# Patient Record
Sex: Male | Born: 1985 | Race: Black or African American | Hispanic: No | Marital: Single | Smoking: Never smoker
Health system: Southern US, Community
[De-identification: ages and names within clinical notes are randomized; demographics above are authoritative.]

## PROBLEM LIST (undated history)

## (undated) DIAGNOSIS — T7840XA Allergy, unspecified, initial encounter: Secondary | ICD-10-CM

## (undated) DIAGNOSIS — J302 Other seasonal allergic rhinitis: Secondary | ICD-10-CM

## (undated) DIAGNOSIS — M549 Dorsalgia, unspecified: Secondary | ICD-10-CM

## (undated) DIAGNOSIS — G43909 Migraine, unspecified, not intractable, without status migrainosus: Secondary | ICD-10-CM

## (undated) HISTORY — DX: Other seasonal allergic rhinitis: J30.2

## (undated) HISTORY — DX: Allergy, unspecified, initial encounter: T78.40XA

---

## 2006-09-26 ENCOUNTER — Emergency Department (HOSPITAL_COMMUNITY): Admission: EM | Admit: 2006-09-26 | Discharge: 2006-09-26 | Payer: Self-pay | Admitting: Emergency Medicine

## 2008-06-13 ENCOUNTER — Emergency Department (HOSPITAL_COMMUNITY): Admission: EM | Admit: 2008-06-13 | Discharge: 2008-06-13 | Payer: Self-pay | Admitting: Family Medicine

## 2009-06-28 ENCOUNTER — Emergency Department (HOSPITAL_COMMUNITY): Admission: EM | Admit: 2009-06-28 | Discharge: 2009-06-28 | Payer: Self-pay | Admitting: Family Medicine

## 2010-02-23 ENCOUNTER — Ambulatory Visit: Payer: Self-pay | Admitting: Family Medicine

## 2010-08-22 LAB — HIV ANTIBODY (ROUTINE TESTING W REFLEX): HIV: NONREACTIVE

## 2010-08-22 LAB — POCT RAPID STREP A (OFFICE): Streptococcus, Group A Screen (Direct): NEGATIVE

## 2011-06-21 ENCOUNTER — Ambulatory Visit
Admission: RE | Admit: 2011-06-21 | Discharge: 2011-06-21 | Disposition: A | Discharge status: Home / Self Care | Payer: 59 | Source: Ambulatory Visit | Attending: Medical | Admitting: Medical

## 2011-06-21 ENCOUNTER — Other Ambulatory Visit: Payer: Self-pay | Admitting: Medical

## 2011-06-21 ENCOUNTER — Ambulatory Visit (INDEPENDENT_AMBULATORY_CARE_PROVIDER_SITE_OTHER): Payer: 59 | Admitting: Medical

## 2011-06-21 ENCOUNTER — Encounter: Payer: Self-pay | Admitting: Medical

## 2011-06-21 VITALS — BP 118/70 | HR 100 | Temp 99.4°F | Resp 16 | Wt 143.0 lb

## 2011-06-21 DIAGNOSIS — R509 Fever, unspecified: Secondary | ICD-10-CM | POA: Insufficient documentation

## 2011-06-21 DIAGNOSIS — R05 Cough: Secondary | ICD-10-CM | POA: Insufficient documentation

## 2011-06-21 DIAGNOSIS — R52 Pain, unspecified: Secondary | ICD-10-CM

## 2011-06-21 LAB — POCT INFLUENZA A/B: Influenza B, POC: NEGATIVE

## 2011-06-21 NOTE — Patient Instructions (Signed)
Go for chest xray now. 

## 2011-06-21 NOTE — Progress Notes (Signed)
Subjective:  Anthony Mcintosh is a 26 y.o. male who presents for possible flu.  He has had a cough intermittently the last week and didn't feel sick.  However yesterday he had abrupt onset of fever to 101.8, body aches, chills, sinus pressure, headache, head congestion, ongoing cough, slight sore throat, slight chest congestion, some ear pressure, and using Tylenol multi symptom and Mucinex.  No specific sick contacts. 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 Otherwise has been in his normal state of health. No other relieving or aggravating factors.  No past medical history on file.   Review of systems: Gen.: Positive fever, chills Cardiac: No chest pain or palpitations Lungs: No shortness of breath or wheezing GI: No nausea, vomiting, diarrhea Extremity: No swelling   Objective:     Filed Vitals:   06/21/11 1541  BP: 118/70  Pulse: 100  Temp: 99.4 F (37.4 C)  Resp: 16     General: Ill-appearing, well-developed, well-nourished, ill-appearing Skin: Hot, moist HEENT: Nose inflamed and congested, clear conjunctiva, TMs pearly, no sinus tenderness, pharynx with erythema, no exudates Neck: Supple, nontender, shotty cervical adenopathy Heart: Regular rate and rhythm, normal S1, S2, no murmurs Lungs: Clear to auscultation bilaterally, no wheezes, rales, rhonchi Abdomen: Nontender non distended Extremities: Mild generalized tenderness      Assessment and Plan:   Encounter Diagnoses  Name Primary?  . Fever Yes  . Body aches   . Cough    Flu test here negative.  I sent him for chest x-ray which turned out to be negative. Prescribed Tamiflu and hydrocodone cough syrup for cough. Discussed supportive care, contagious nature of his illness, prevention, rest, good hydration, ibuprofen, and call or return if worse or not improving by the end of the week. Advise he stay home from work and avoid others until significantly improved and no fever. Discussed  possible secondary complications of the flu.

## 2011-08-03 ENCOUNTER — Ambulatory Visit (INDEPENDENT_AMBULATORY_CARE_PROVIDER_SITE_OTHER): Payer: 59 | Admitting: Medical

## 2011-08-03 ENCOUNTER — Encounter: Payer: Self-pay | Admitting: Medical

## 2011-08-03 VITALS — BP 110/60 | HR 72 | Temp 98.6°F | Resp 16 | Wt 144.0 lb

## 2011-08-03 DIAGNOSIS — J039 Acute tonsillitis, unspecified: Secondary | ICD-10-CM

## 2011-08-03 MED ORDER — AMOXICILLIN 875 MG PO TABS: 875.0000 mg | ORAL_TABLET | Freq: Two times a day (BID) | ORAL | Status: AC

## 2011-08-03 NOTE — Patient Instructions (Signed)
Salt water gargles, warm fluids, honey/lemon mixture, sore throat spray OTC, Ibuprofen up to 800mg  every 6 hours for pain.     Begin Amoxicillin twice daily for 10 days.    If worse or not improving let me know.    Tonsillitis Tonsils are lumps of lymphoid tissues at the back of the throat. Each tonsil has 20 crevices (crypts). Tonsils help fight nose and throat infections and keep infection from spreading to other parts of the body for the first 18 months of life. Tonsillitis is an infection of the throat that causes the tonsils to become red, tender, and swollen. CAUSES Sudden and, if treated, temporary (acute) tonsillitis is usually caused by infection with streptococcal bacteria. Long lasting (chronic) tonsillitis occurs when the crypts of the tonsils become filled with pieces of food and bacteria, which makes it easy for the tonsils to become constantly infected. SYMPTOMS  Symptoms of tonsillitis include:  A sore throat.   White patches on the tonsils.   Fever.   Tiredness.  DIAGNOSIS Tonsillitis can be diagnosed through a physical exam. Diagnosis can be confirmed with the results of lab tests, including a throat culture. TREATMENT  The goals of tonsillitis treatment include the reduction of the severity and duration of symptoms, prevention of associated conditions, and prevention of disease transmission. Tonsillitis caused by bacteria can be treated with antibiotics. Usually, treatment with antibiotics is started before the cause of the tonsillitis is known. However, if it is determined that the cause is not bacterial, antibiotics will not treat the tonsillitis. If attacks of tonsillitis are severe and frequent, your caregiver may recommend surgery to remove the tonsils (tonsillectomy). HOME CARE INSTRUCTIONS   Rest as much as possible and get plenty of sleep.   Drink plenty of fluids. While the throat is very sore, eat soft foods or liquids, such as sherbet, soups, or instant  breakfast drinks.   Eat frozen ice pops.   Older children and adults may gargle with a warm or cold liquid to help soothe the throat. Mix 1 teaspoon of salt in 1 cup of water.   Other family members who also develop a sore throat or fever should have a medical exam or throat culture.   Only take over-the-counter or prescription medicines for pain, discomfort, or fever as directed by your caregiver.  SEEK MEDICAL CARE IF:   Your baby is older than 3 months with a rectal temperature of 100.5 F (38.1 C) or higher for more than 1 day.   Large, tender lumps develop in your neck.   A rash develops.   Green, yellow-brown, or bloody substance is coughed up.   You are unable to swallow liquids or food for 24 hours.   Your child is unable to swallow food or liquids for 12 hours.  SEEK IMMEDIATE MEDICAL CARE IF:   You develop any new symptoms such as vomiting, severe headache, stiff neck, chest pain, or trouble breathing or swallowing.   You have severe throat pain along with drooling or voice changes.   You have severe pain, unrelieved with recommended medications.   You are unable to fully open the mouth.   You develop redness, swelling, or severe pain anywhere in the neck.   You have a fever.   Your baby is older than 3 months with a rectal temperature of 102 F (38.9 C) or higher.   Your baby is 33 months old or younger with a rectal temperature of 100.4 F (38 C) or higher.  MAKE SURE YOU:   Understand these instructions.   Will watch your condition.   Will get help right away if you are not doing well or get worse.  Document Released: 03/02/2005 Document Revised: 02/02/2011 Document Reviewed: 07/29/2010 [facility] Patient Information 2012 [place], [place].

## 2011-08-03 NOTE — Progress Notes (Signed)
Subjective:  Anthony Mcintosh is a 26 y.o. male who presents for 4 day history of sore throat, worsening, pain with swallowing, right ear pressure, nasal congestion, some cough, using Sudafed and Flonase. He is also using some ibuprofen for pain. He is a nonsmoker. No recent sick contacts. No other aggravating or relieving factors.  I saw him recently for influenza. He did end up taking Tamiflu and got completely over the infection.   No past medical history on file.  ROS:  general: No fever, chills, sweats Skin: No rash HEENT: No sinus pressure Heart: No chest pain or palpitations Lungs: The shortness of breath or wheezing GI: No nausea, vomiting, diarrhea  Objective:      Filed Vitals:   08/03/11 1548  BP: 110/60  Pulse: 72  Temp: 98.6 F (37 C)  Resp: 16    General appearance: no distress, WD/WN, somewhat ill-appearing HEENT: normocephalic, conjunctiva/corneas normal, sclerae anicteric, nares patent, no discharge or erythema, pharynx with erythema, tonsils 2+ with erythema and purulent exudate.  Oral cavity: MMM, no lesions  Neck: supple, +shoddy lymphadenopathy, no thyromegaly Heart: RRR, normal S1, S2, no murmurs Lungs: CTA bilaterally, no wheezes, rhonchi, or rales Abdomen: +bs, soft, non tender, non distended, no masses, no hepatomegaly, no splenomegaly  Assessment and Plan:   Encounter Diagnosis  Name Primary?  . Tonsillitis with exudate Yes   Prescription for amoxicillin, discussed symptomatic treatment including salt water gargles, warm fluids, rest, hydrate well, can use over-the-counter ibuprofen  for throat pain, fever, or malaise. If worse or not improving within 2-3 days, call or return.

## 2011-08-18 ENCOUNTER — Emergency Department (INDEPENDENT_AMBULATORY_CARE_PROVIDER_SITE_OTHER)
Admission: EM | Admit: 2011-08-18 | Discharge: 2011-08-18 | Disposition: A | Discharge status: Home / Self Care | Payer: 59 | Source: Home / Self Care | Attending: Emergency Medicine | Admitting: Emergency Medicine

## 2011-08-18 ENCOUNTER — Encounter (HOSPITAL_COMMUNITY): Payer: Self-pay | Admitting: *Deleted

## 2011-08-18 DIAGNOSIS — S335XXA Sprain of ligaments of lumbar spine, initial encounter: Secondary | ICD-10-CM

## 2011-08-18 DIAGNOSIS — S161XXA Strain of muscle, fascia and tendon at neck level, initial encounter: Secondary | ICD-10-CM

## 2011-08-18 DIAGNOSIS — S139XXA Sprain of joints and ligaments of unspecified parts of neck, initial encounter: Secondary | ICD-10-CM

## 2011-08-18 MED ORDER — HYDROCODONE-ACETAMINOPHEN 5-325 MG PO TABS: 2.0000 | ORAL_TABLET | ORAL | Status: AC | PRN

## 2011-08-18 MED ORDER — METAXALONE 800 MG PO TABS: 800.0000 mg | ORAL_TABLET | Freq: Three times a day (TID) | ORAL | Status: AC

## 2011-08-18 MED ORDER — IBUPROFEN 600 MG PO TABS: 600.0000 mg | ORAL_TABLET | Freq: Four times a day (QID) | ORAL | Status: AC | PRN

## 2011-08-18 NOTE — Discharge Instructions (Signed)
Take the medication as written. Take 1 gram of tylenol with the motrin up to 4 times a day as needed for pain and fever. This is an effective combination for pain. Take the hydrocodone/norco only for severe pain. Do not take the tylenol and hydrocodone/norcoas they both have tylenol in them and too much can hurt your liver. Return if you get worse, have a  fever >100.4, or for any concerns.   Go to www.goodrx.com to look up your medications. This will give you a list of where you can find your prescriptions at the most affordable prices.
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
## 2012-06-08 ENCOUNTER — Ambulatory Visit (INDEPENDENT_AMBULATORY_CARE_PROVIDER_SITE_OTHER): Payer: 59 | Admitting: Family Medicine

## 2012-06-08 ENCOUNTER — Encounter: Payer: Self-pay | Admitting: Family Medicine

## 2012-06-08 VITALS — BP 100/60 | HR 144 | Temp 103.2°F | Wt 142.0 lb

## 2012-06-08 DIAGNOSIS — J039 Acute tonsillitis, unspecified: Secondary | ICD-10-CM

## 2012-06-08 DIAGNOSIS — J029 Acute pharyngitis, unspecified: Secondary | ICD-10-CM

## 2012-06-08 LAB — POCT RAPID STREP A (OFFICE): Rapid Strep A Screen: NEGATIVE

## 2012-06-08 MED ORDER — AMOXICILLIN 875 MG PO TABS: 875.0000 mg | ORAL_TABLET | Freq: Two times a day (BID) | ORAL | Status: DC

## 2012-06-08 NOTE — Patient Instructions (Signed)
Tonsillitis Tonsils are lumps of lymphoid tissues at the back of the throat. Each tonsil has 20 crevices (crypts). Tonsils help fight nose and throat infections and keep infection from spreading to other parts of the body for the first 18 months of life. Tonsillitis is an infection of the throat that causes the tonsils to become red, tender, and swollen. CAUSES Sudden and, if treated, temporary (acute) tonsillitis is usually caused by infection with streptococcal bacteria. Long lasting (chronic) tonsillitis occurs when the crypts of the tonsils become filled with pieces of food and bacteria, which makes it easy for the tonsils to become constantly infected. SYMPTOMS  Symptoms of tonsillitis include:  A sore throat.  White patches on the tonsils.  Fever.  Tiredness. DIAGNOSIS Tonsillitis can be diagnosed through a physical exam. Diagnosis can be confirmed with the results of lab tests, including a throat culture. TREATMENT  The goals of tonsillitis treatment include the reduction of the severity and duration of symptoms, prevention of associated conditions, and prevention of disease transmission. Tonsillitis caused by bacteria can be treated with antibiotics. Usually, treatment with antibiotics is started before the cause of the tonsillitis is known. However, if it is determined that the cause is not bacterial, antibiotics will not treat the tonsillitis. If attacks of tonsillitis are severe and frequent, your caregiver may recommend surgery to remove the tonsils (tonsillectomy). HOME CARE INSTRUCTIONS   Rest as much as possible and get plenty of sleep.  Drink plenty of fluids. While the throat is very sore, eat soft foods or liquids, such as sherbet, soups, or instant breakfast drinks.  Eat frozen ice pops.  Older children and adults may gargle with a warm or cold liquid to help soothe the throat. Mix 1 teaspoon of salt in 1 cup of water.  Other family members who also develop a sore  throat or fever should have a medical exam or throat culture.  Only take over-the-counter or prescription medicines for pain, discomfort, or fever as directed by your caregiver.  If you are given antibiotics, take them as directed. Finish them even if you start to feel better. SEEK MEDICAL CARE IF:   Your baby is older than 3 months with a rectal temperature of 100.5 F (38.1 C) or higher for more than 1 day.  Large, tender lumps develop in your neck.  A rash develops.  Green, yellow-brown, or bloody substance is coughed up.  You are unable to swallow liquids or food for 24 hours.  Your child is unable to swallow food or liquids for 12 hours. SEEK IMMEDIATE MEDICAL CARE IF:   You develop any new symptoms such as vomiting, severe headache, stiff neck, chest pain, or trouble breathing or swallowing.  You have severe throat pain along with drooling or voice changes.  You have severe pain, unrelieved with recommended medications.  You are unable to fully open the mouth.  You develop redness, swelling, or severe pain anywhere in the neck.  You have a fever.  Your baby is older than 3 months with a rectal temperature of 102 F (38.9 C) or higher.  Your baby is 3 months old or younger with a rectal temperature of 100.4 F (38 C) or higher. MAKE SURE YOU:   Understand these instructions.  Will watch your condition.  Will get help right away if you are not doing well or get worse. Document Released: 03/02/2005 Document Revised: 08/15/2011 Document Reviewed: 07/29/2010 [employer] Patient Information 2013 [employer].  

## 2012-06-08 NOTE — Progress Notes (Signed)
  Subjective:    Patient ID: Anthony Mcintosh, male    DOB: 04-27-1986, 27 y.o.   MRN: [id number]  HPI He has a one-day history of started with sore throat, fever, chills, myalgias, nasal congestion. No cough, chest congestion, earache, nausea or vomiting. He has no underlying allergies and he does not smoke.   Review of Systems     Objective:   Physical Exam alert and in no distress. Tympanic membranes and canals are normal. Throat is clear. Tonsils are red swollen with exudates. Neck is supple with adenopathy no thyromegaly. Cardiac exam shows a tachycardia without murmurs or gallops. Lungs are clear to auscultation. Strep screen negative      Assessment & Plan:   1. Sore throat  POCT rapid strep A  2. Tonsillitis with exudate  amoxicillin (AMOXIL) 875 MG tablet   in spite of negative strep screen, he has all the clinical criteria for strep and therefore I will treat him.

## 2012-11-08 NOTE — Progress Notes (Deleted)
  Subjective:    Patient ID: Anthony Mcintosh, male    DOB: 09-24-1985, 27 y.o.   MRN: [id number]  HPI    Review of Systems  Constitutional: Negative for chills.       Objective:   Physical Exam        Assessment & Plan:

## 2012-11-09 ENCOUNTER — Ambulatory Visit (INDEPENDENT_AMBULATORY_CARE_PROVIDER_SITE_OTHER): Payer: 59 | Admitting: Medical

## 2012-11-09 ENCOUNTER — Encounter: Payer: Self-pay | Admitting: Medical

## 2012-11-09 VITALS — BP 100/80 | HR 66 | Temp 98.1°F | Resp 16 | Wt 139.0 lb

## 2012-11-09 DIAGNOSIS — L739 Follicular disorder, unspecified: Secondary | ICD-10-CM

## 2012-11-09 DIAGNOSIS — L738 Other specified follicular disorders: Secondary | ICD-10-CM

## 2012-11-09 MED ORDER — CEPHALEXIN 500 MG PO CAPS: 500.0000 mg | ORAL_CAPSULE | Freq: Three times a day (TID) | ORAL | Status: DC

## 2012-11-09 NOTE — Progress Notes (Signed)
Subjective:  Anthony Mcintosh is a 27 y.o. male who presents with 1 week hx/o knot in perineum area, then pain began yesterday 5/10.  Sensitive to touch and presure, discomfort with sitting for long periods.   Has had this 1-2 times prior in the last few years.  They resolved on their own, didn't get medical evaluation.  In the past the bumps were like pimples.   This is more sore than prior similar. In the past had drainage from a similar pimple like lesion in the same area. No hx/o cellulitis or abscess.  Denies fever, nausea, vomiting, diarrhea, blood in stool, no drainage.  No penile or scrotal swelling or pain.  No redness, no warmth.  Had BM every other day.  used laxative the other day for constipation.  Wonders if this is correlated with exercise or activity.  No GU change otherwise.  No abdominal pain.   No other aggravating or relieving factors.    No other c/o.  The following portions of the patient's history were reviewed and updated as appropriate: allergies, current medications, past family history, past medical history, past social history, past surgical history and problem list.  ROS Otherwise as in subjective above  Objective: Physical Exam  Vital signs reviewed  General appearance: alert, no distress, WD/WN Abdomen: +bs, soft, non tender, non distended, no masses, no hepatomegaly, no splenomegaly GU: normal circumcised male genitalia, no swelling, no mass, no rash, nontender, no inguinal lymphadenopathy Skin: left inner thigh lateral to scrotum with 1.2 cm round tender mass superficially that is tender, but no induration, no warmth, no erythema   Assessment: Encounter Diagnosis  Name Primary?  . Hair follicle infection Yes     Plan: Patient Instructions  Begin Keflex 3 times daily.  If all resolved in 1 week, then stop antibiotic after 7 days.   If not, go the full 10 days.     If not improved by Monday, call back.   Use warm compresses to the area.   Keep the area  clean with soap and water.    Call or return if not improving.

## 2012-11-09 NOTE — Patient Instructions (Signed)
Begin Keflex 3 times daily.  If all resolved in 1 week, then stop antibiotic after 7 days.   If not, go the full 10 days.     If not improved by Monday, call back.   Use warm compresses to the area.   Keep the area clean with soap and water.    Call or return if not improving.

## 2013-02-01 ENCOUNTER — Encounter: Payer: Self-pay | Admitting: Internal Medicine

## 2013-02-11 ENCOUNTER — Ambulatory Visit (INDEPENDENT_AMBULATORY_CARE_PROVIDER_SITE_OTHER): Payer: 59 | Admitting: Family Medicine

## 2013-02-11 ENCOUNTER — Encounter: Payer: Self-pay | Admitting: Family Medicine

## 2013-02-11 VITALS — BP 116/78 | HR 66 | Ht 69.0 in | Wt 140.0 lb

## 2013-02-11 DIAGNOSIS — K5900 Constipation, unspecified: Secondary | ICD-10-CM

## 2013-02-11 DIAGNOSIS — J301 Allergic rhinitis due to pollen: Secondary | ICD-10-CM

## 2013-02-11 DIAGNOSIS — Z0000 Encounter for general adult medical examination without abnormal findings: Secondary | ICD-10-CM

## 2013-02-11 LAB — CBC WITH DIFFERENTIAL/PLATELET
Eosinophils Absolute: 0.1 10*3/uL (ref 0.0–0.7)
Eosinophils Relative: 2 % (ref 0–5)
HCT: 46.2 % (ref 39.0–52.0)
Hemoglobin: 15.7 g/dL (ref 13.0–17.0)
Lymphocytes Relative: 22 % (ref 12–46)
Lymphs Abs: 1.7 10*3/uL (ref 0.7–4.0)
MCH: 28.5 pg (ref 26.0–34.0)
MCV: 84 fL (ref 78.0–100.0)
Monocytes Absolute: 0.6 10*3/uL (ref 0.1–1.0)
Monocytes Relative: 7 % (ref 3–12)
Platelets: 295 10*3/uL (ref 150–400)
RBC: 5.5 MIL/uL (ref 4.22–5.81)
WBC: 7.7 10*3/uL (ref 4.0–10.5)

## 2013-02-11 LAB — COMPREHENSIVE METABOLIC PANEL
ALT: 20 U/L (ref 0–53)
CO2: 31 mEq/L (ref 19–32)
Calcium: 10.1 mg/dL (ref 8.4–10.5)
Chloride: 99 mEq/L (ref 96–112)
Creat: 0.88 mg/dL (ref 0.50–1.35)
Glucose, Bld: 77 mg/dL (ref 70–99)
Total Bilirubin: 0.6 mg/dL (ref 0.3–1.2)

## 2013-02-11 LAB — LIPID PANEL
Cholesterol: 163 mg/dL (ref 0–200)
Total CHOL/HDL Ratio: 2.9 Ratio
Triglycerides: 66 mg/dL (ref <150)
VLDL: 13 mg/dL (ref 0–40)

## 2013-02-11 LAB — HEMOCCULT GUIAC POC 1CARD (OFFICE)

## 2013-02-11 NOTE — Progress Notes (Signed)
Subjective:    Patient ID: Anthony Mcintosh, male    DOB: Sep 01, 1985, 27 y.o.   MRN: [id number]  HPI He is here for complete examination. He notes a several month history of difficulty with constipation. He has not changed his eating habits or activity habits. He has occasionally tried a laxative with questionable results. He also notes intermittent rectal pain and itching for the last 6 months. He has tried some OTC preparations. He cannot relate the pain and the constipation. He has occasionally seen blood on the toilet paper. He does run regularly and has noted increased chest discomfort at a shorter distance and in the past. He has not changed his running pattern in regard to distance, terrain, equipment. He has had no PND, irregular heartbeat. No family history of heart disease. He does have springtime allergies.  Review of Systems  Constitutional: Negative.   HENT: Negative.   Eyes: Negative.   Respiratory: Negative.   Cardiovascular: Positive for chest pain.  Gastrointestinal: Positive for constipation, blood in stool and anal bleeding.  Endocrine: Negative.   Genitourinary: Negative.   Musculoskeletal: Negative.        Objective:   Physical Exam BP 116/78  Pulse 66  Ht 5\' 9"  (1.753 m)  Wt 140 lb (63.504 kg)  BMI 20.67 kg/m2  General Appearance:    Alert, cooperative, no distress, appears stated age  Head:    Normocephalic, without obvious abnormality, atraumatic  Eyes:    PERRL, conjunctiva/corneas clear, EOM's intact, fundi    benign  Ears:    Normal TM's and external ear canals  Nose:   Nares normal, mucosa normal, no drainage or sinus   tenderness  Throat:   Lips, mucosa, and tongue normal; teeth and gums normal  Neck:   Supple, no lymphadenopathy;  thyroid:  no   enlargement/tenderness/nodules; no carotid   bruit or JVD  Back:    Spine nontender, no curvature, ROM normal, no CVA     tenderness  Lungs:     Clear to auscultation bilaterally without wheezes, rales or      ronchi; respirations unlabored  Chest Wall:    No tenderness or deformity   Heart:    Regular rate and rhythm, S1 and S2 normal, no murmur, rub   or gallop  Breast Exam:    No chest wall tenderness, masses or gynecomastia  Abdomen:     Soft, non-tender, nondistended, normoactive bowel sounds,    no masses, no hepatosplenomegaly  Genitalia:    Normal male external genitalia without lesions.  Testicles without masses.  No inguinal hernias.  Rectal:   visual exam shows no lesions. Rectal exam showed minimal stool in vault that was guaiac negative.   Extremities:   No clubbing, cyanosis or edema  Pulses:   2+ and symmetric all extremities  Skin:   Skin color, texture, turgor normal, no rashes or lesions  Lymph nodes:   Cervical, supraclavicular, and axillary nodes normal  Neurologic:   CNII-XII intact, normal strength, sensation and gait; reflexes 2+ and symmetric throughout          Psych:   Normal mood, affect, hygiene and grooming.          Assessment & Plan:  Routine general medical examination at a health care facility - Plan: Hemoccult - 1 Card (office), CBC with Differential, Comprehensive metabolic panel, Lipid panel  Allergic rhinitis due to pollen  Constipation  Recommend slowly increasing his physical activity to see if this makes a difference  in his chest discomfort while running. If he has continued difficulty with that, further evaluation will be needed. Also recommend fluids, bulk in diet, continue with physical activity and does need to his body. Recommended Metamucil etc. for bulk laxative.

## 2013-02-11 NOTE — Patient Instructions (Signed)
Start out at a slower pace when you run for the first half mile or so than take it up and see what that does to help with your symptoms. The best way to treat constipation is plenty of fluids, bulk in your diet, exercise, listen to your body. Metamucil or any other bulk laxatives fine

## 2013-02-12 NOTE — Progress Notes (Signed)
Quick Note:  CALLED PT HOME/CELL# PT INFORMED AND VERBALIZED UNDERSTANDING ______

## 2013-03-31 ENCOUNTER — Emergency Department (HOSPITAL_COMMUNITY)
Admission: EM | Admit: 2013-03-31 | Discharge: 2013-03-31 | Disposition: A | Discharge status: Home / Self Care | Payer: 59 | Source: Home / Self Care | Attending: Family Medicine | Admitting: Family Medicine

## 2013-03-31 ENCOUNTER — Encounter (HOSPITAL_COMMUNITY): Payer: Self-pay | Admitting: Emergency Medicine

## 2013-03-31 DIAGNOSIS — B9789 Other viral agents as the cause of diseases classified elsewhere: Secondary | ICD-10-CM

## 2013-03-31 MED ORDER — BENZONATATE 100 MG PO CAPS: 100.0000 mg | ORAL_CAPSULE | Freq: Three times a day (TID) | ORAL | Status: DC

## 2013-03-31 NOTE — ED Provider Notes (Signed)
Medical screening examination/treatment/procedure(s) were performed by a resident physician or non-physician practitioner and as the supervising physician I was immediately available for consultation/collaboration.  Clementeen Graham, MD    Rodolph Bong, MD 03/31/13 1910

## 2013-03-31 NOTE — ED Provider Notes (Signed)
CSN: [id number]     Arrival date & time 03/31/13  1208 History   First MD Initiated Contact with Patient 03/31/13 1349     Chief Complaint  Patient presents with  . Cough   (Consider location/radiation/quality/duration/timing/severity/associated sxs/prior Treatment) Patient is a 27 y.o. male presenting with cough. The history is provided by the patient. No language interpreter was used.  Cough Cough characteristics:  Productive Sputum characteristics:  Nondescript Severity:  Moderate Onset quality:  Gradual Duration:  4 days Timing:  Constant Progression:  Worsening Chronicity:  New Smoker: no   Relieved by:  Nothing Worsened by:  Nothing tried Ineffective treatments:  None tried Associated symptoms: shortness of breath   Associated symptoms: no fever     History reviewed. No pertinent past medical history. History reviewed. No pertinent past surgical history. History reviewed. No pertinent family history. History  Substance Use Topics  . Smoking status: Never Smoker   . Smokeless tobacco: Not on file  . Alcohol Use: 0.6 oz/week    1 Cans of beer per week    Review of Systems  Constitutional: Negative for fever.  Respiratory: Positive for cough and shortness of breath.   All other systems reviewed and are negative.    Allergies  Review of patient's allergies indicates no known allergies.  Home Medications   Current Outpatient Rx  Name  Route  Sig  Dispense  Refill  . fexofenadine (ALLEGRA) 180 MG tablet   Oral   Take 180 mg by mouth daily.         Marland Kitchen ibuprofen (ADVIL,MOTRIN) 600 MG tablet   Oral   Take 1 tablet (600 mg total) by mouth every 6 (six) hours as needed for pain.   30 tablet   0   . pseudoephedrine (SUDAFED) 60 MG tablet   Oral   Take 60 mg by mouth every 4 (four) hours as needed for congestion.          BP 129/71  Pulse 70  Temp(Src) 98.1 F (36.7 C) (Oral)  Resp 18  SpO2 100% Physical Exam  Nursing note and vitals  reviewed. Constitutional: He appears well-developed and well-nourished.  HENT:  Head: Normocephalic.  Right Ear: External ear normal.  Left Ear: External ear normal.  Mouth/Throat: Oropharynx is clear and moist.  Eyes: Conjunctivae are normal. Pupils are equal, round, and reactive to light.  Neck: Normal range of motion.  Cardiovascular: Normal rate.   Pulmonary/Chest: Effort normal.  Abdominal: Soft.  Musculoskeletal: Normal range of motion.  Neurological: He is alert.  Skin: Skin is warm.    ED Course  Procedures (including critical care time) Labs Review Labs Reviewed - No data to display Imaging Review No results found.  EKG Interpretation     Ventricular Rate:    PR Interval:    QRS Duration:   QT Interval:    QTC Calculation:   R Axis:     Text Interpretation:              MDM   1. Viral respiratory illness   tessalon perles    Anthony Areas, PA-C 03/31/13 1439

## 2013-03-31 NOTE — ED Notes (Signed)
Pt   Reports  Symptoms  Of  Cough      /  Congested       -      Pt    Reports          The           Cough    X     4   Days            -         Pt     Is  Sitting upright on     Exam table    In   No  Acute  Distress

## 2013-04-23 ENCOUNTER — Encounter: Payer: Self-pay | Admitting: Family Medicine

## 2013-04-23 ENCOUNTER — Ambulatory Visit (INDEPENDENT_AMBULATORY_CARE_PROVIDER_SITE_OTHER): Payer: 59 | Admitting: Family Medicine

## 2013-04-23 VITALS — BP 110/70 | HR 111 | Temp 98.2°F | Wt 141.0 lb

## 2013-04-23 DIAGNOSIS — Z23 Encounter for immunization: Secondary | ICD-10-CM

## 2013-04-23 DIAGNOSIS — J209 Acute bronchitis, unspecified: Secondary | ICD-10-CM

## 2013-04-23 MED ORDER — CLARITHROMYCIN 500 MG PO TABS
500.0000 mg | ORAL_TABLET | Freq: Two times a day (BID) | ORAL | Status: DC
Start: 2013-04-23 — End: 2014-03-26

## 2013-04-23 NOTE — Patient Instructions (Signed)
Take all the antibiotic and if not totally back to normal when you finish it give me a call

## 2013-04-23 NOTE — Progress Notes (Signed)
Teaching Physician: Sharlot Gowda, MD Dictated By: Judithann Graves  Subjective:  Anthony Mcintosh is a 27 y.o. male with PMH of allergic rhinitis who presents for evaluation of 1 month of dry cough. Four days following the onset of his dry cough, he became hoarse and he also had night time paroxysmal fits of coughing that it made it difficult to sleep. He went to an urgent care facility and they prescribed him 10 days of benzonatate. The benzonatate was helpful in decreasing the frequency of the cough ultimately but at the end of his 10 day allotment, his cough resumed. Of note he has baseline sinus congestion throughout the year that he manages with sudafed, taking it regularly. He mentions that he has recently noticed some postnasal drainage. He has never been febrile throughout this episode, he has had no rashes save for his usual eczema, no hemoptysis, and SOB only during coughing fits. He has no throat pain, no wheeze. He has no heartburn, reflux, or early satiety. He is a nonsmoker.    ROS as in subjective.   Objective: Filed Vitals:   04/23/13 1434  BP: 110/70  Pulse: 111  Temp: 98.2 F (36.8 C)    Physical Exam:  General: Alert and in no distress  HEENT: Tympanic membranes and canals are normal. Throat is clear. Tonsils are normal.  Neck: Supple without adenopathy or thyromegaly CV: Regular sinus rhythm without murmurs or gallops  Puml: Lungs are clear to auscultation    Assessment and Plan: 1. Acute bronchitis - clarithromycin (BIAXIN) 500 MG tablet; Take 1 tablet (500 mg total) by mouth 2 (two) times daily.  Dispense: 20 tablet; Refill: 0  2. Need for prophylactic vaccination and inoculation against influenza - Flu Vaccine QUAD 36+ mos IM   Dr. Susann Givens was present for the encounter and agrees with the above assessment and plan.

## 2013-06-22 IMAGING — CR DG CHEST 2V
2 series · 2 of 2 positions shown · non-contrast
Comparison: None.

CLINICAL DATA: Cough, fever

CHEST - 2 VIEW

[view not recorded (1 of 2)]
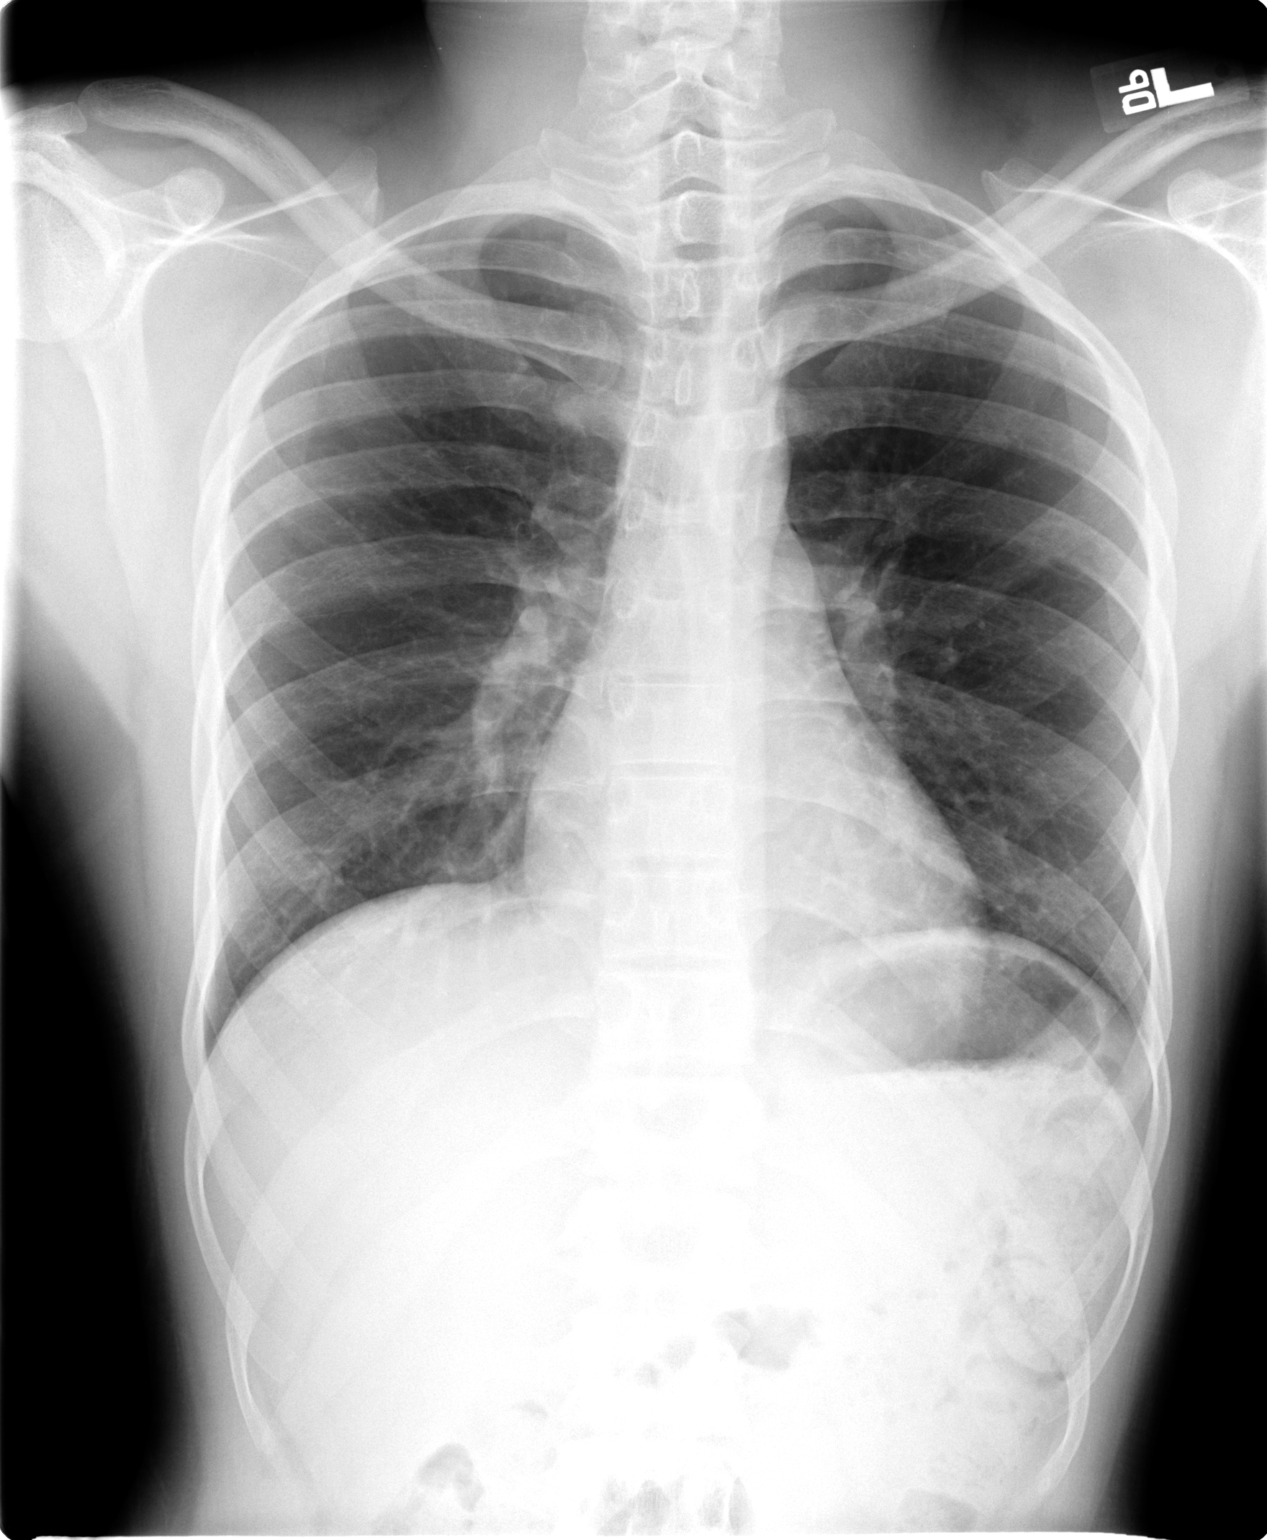

[view not recorded (2 of 2)]
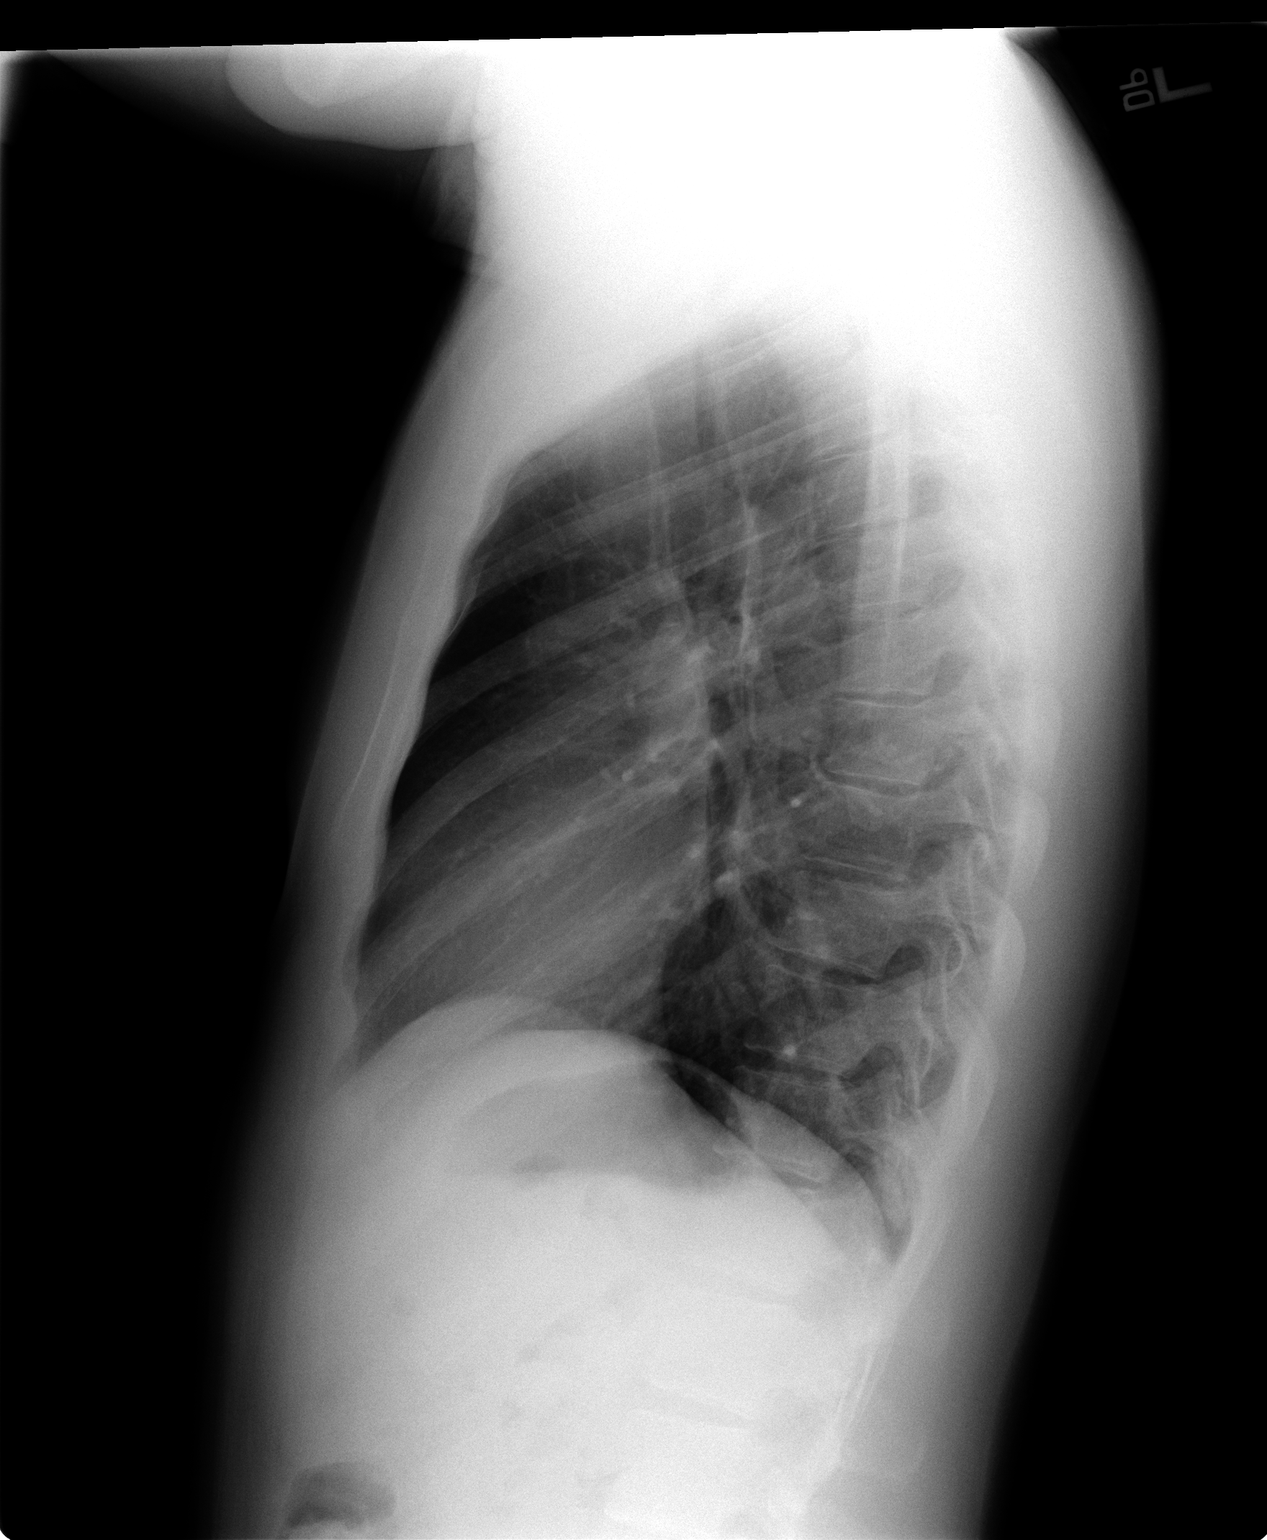

[2 of 2 positions shown; findings below may reference images not displayed]

FINDINGS: No active infiltrate or effusion is seen.  Mediastinal
contours are normal.  The heart is within normal limits in size.
No bony abnormality is noted.
IMPRESSION: No active lung disease.

## 2014-03-26 ENCOUNTER — Ambulatory Visit (INDEPENDENT_AMBULATORY_CARE_PROVIDER_SITE_OTHER): Payer: 59 | Admitting: Family Medicine

## 2014-03-26 ENCOUNTER — Encounter: Payer: Self-pay | Admitting: Family Medicine

## 2014-03-26 VITALS — BP 112/70 | HR 84 | Ht 68.0 in | Wt 140.0 lb

## 2014-03-26 DIAGNOSIS — G44219 Episodic tension-type headache, not intractable: Secondary | ICD-10-CM

## 2014-03-26 DIAGNOSIS — L639 Alopecia areata, unspecified: Secondary | ICD-10-CM

## 2014-03-26 DIAGNOSIS — Z0000 Encounter for general adult medical examination without abnormal findings: Secondary | ICD-10-CM

## 2014-03-26 DIAGNOSIS — J301 Allergic rhinitis due to pollen: Secondary | ICD-10-CM

## 2014-03-26 NOTE — Progress Notes (Signed)
Subjective:    Patient ID: Anthony Mcintosh, male    DOB: [birth year], 28 y.o.   MRN: [id number]  HPI He is here for complete examination. He has had difficulty over the last 3 months with worsening headache. It is intermittent in nature usually occurring twice per week. He rates it a 7/10. It can last several hours. It is bitemporal and constant but no blurred vision, double vision, nausea, vomiting. He cannot relate this to stress [place] made [place] a permanent move. In the past ibuprofen 600 mg would work to control pain but lately it has not. He also is had some difficulty with hair loss. He has a previous history of alopecia areata and did have ear is injected with steroids. He has a history of allergies and does take OTC meds for this. Family and social history were reviewed.   Review of Systems  All other systems reviewed and are negative.      Objective:   Physical Exam BP 112/70  Pulse 84  Ht 5\' 8"  (1.727 m)  Wt 140 lb (63.504 kg)  BMI 21.29 kg/m2  SpO2 98%  General Appearance:    Alert, cooperative, no distress, appears stated age  Head:    Normocephalic, without obvious abnormality, atraumatic. Scattered areas of hair loss are noted.   Eyes:    PERRL, conjunctiva/corneas clear, EOM's intact, fundi    benign  Ears:    Normal TM's and external ear canals  Nose:   Nares normal, mucosa normal, no drainage or sinus   tenderness  Throat:   Lips, mucosa, and tongue normal; teeth and gums normal  Neck:   Supple, no lymphadenopathy;  thyroid:  no   enlargement/tenderness/nodules; no carotid   bruit or JVD  Back:    Spine nontender, no curvature, ROM normal, no CVA     tenderness  Lungs:     Clear to auscultation bilaterally without wheezes, rales or     ronchi; respirations unlabored  Chest Wall:    No tenderness or deformity   Heart:    Regular rate and rhythm, S1 and S2 normal, no murmur, rub   or gallop  Breast Exam:    No chest wall  tenderness, masses or gynecomastia  Abdomen:     Soft, non-tender, nondistended, normoactive bowel sounds,    no masses, no hepatosplenomegaly  Genitalia:    Normal male external genitalia without lesions.  Testicles without masses.  No inguinal hernias.  Rectal:   Deferred due to age <40 and lack of symptoms  Extremities:   No clubbing, cyanosis or edema  Pulses:   2+ and symmetric all extremities  Skin:   Skin color, texture, turgor normal, no rashes or lesions  Lymph nodes:   Cervical, supraclavicular, and axillary nodes normal  Neurologic:   CNII-XII intact, normal strength, sensation and gait; reflexes 2+ and symmetric throughout          Psych:   Normal mood, affect, hygiene and grooming.          Assessment & Plan:  Routine general medical examination at a health care facility  Allergic rhinitis due to pollen  Alopecia areata  Episodic tension-type headache, not intractable  recommend trying cortisone cream for the alopecia if no improvement, he will see his dermatologist for injections. Also discussed the headaches and recommended increasing the dosing the ibuprofen to 800 mg 3 times a day and possibly mixing this with Tylenol. That  doesn't work, try Aleve 2 pills twice per day. Further difficulty will require him to see someone in the [place].

## 2014-03-26 NOTE — Patient Instructions (Addendum)
Try Cortaid twice a day and rub in the small amount of areas Use 800 mg of ibuprofen 3 times per day and started immediately with the headache. If that doesn't work he can switch to Aleve 2 pills twice a day and you can make Tylenol with both legs

## 2017-09-25 ENCOUNTER — Emergency Department
Admission: EM | Admit: 2017-09-25 | Discharge: 2017-09-25 | Disposition: A | Discharge status: Home / Self Care | Payer: BC Managed Care – PPO | Attending: Emergency Medicine | Admitting: Emergency Medicine

## 2017-09-25 ENCOUNTER — Emergency Department: Payer: BC Managed Care – PPO

## 2017-09-25 DIAGNOSIS — W260XXA Contact with knife, initial encounter: Secondary | ICD-10-CM | POA: Insufficient documentation

## 2017-09-25 DIAGNOSIS — S61210A Laceration without foreign body of right index finger without damage to nail, initial encounter: Secondary | ICD-10-CM | POA: Insufficient documentation

## 2017-09-25 DIAGNOSIS — Z23 Encounter for immunization: Secondary | ICD-10-CM

## 2017-09-25 DIAGNOSIS — Y9389 Activity, other specified: Secondary | ICD-10-CM | POA: Insufficient documentation

## 2017-09-25 MED ORDER — ACETAMINOPHEN 325 MG PO TABS
650.00 mg | ORAL_TABLET | Freq: Once | ORAL | Status: AC
Start: 2017-09-25 — End: 2017-09-25
  Administered 2017-09-25: 22:00:00 650 mg via ORAL
  Filled 2017-09-25: qty 2

## 2017-09-25 MED ORDER — IBUPROFEN 400 MG PO TABS
800.00 mg | ORAL_TABLET | Freq: Once | ORAL | Status: AC
Start: 2017-09-25 — End: 2017-09-25
  Administered 2017-09-25: 22:00:00 800 mg via ORAL
  Filled 2017-09-25: qty 2

## 2017-09-25 MED ORDER — TETANUS-DIPHTH-ACELL PERTUSSIS 5-2.5-18.5 LF-MCG/0.5 IM SUSP
0.50 mL | Freq: Once | INTRAMUSCULAR | Status: AC
Start: 2017-09-25 — End: 2017-09-25
  Administered 2017-09-25: 23:00:00 0.5 mL via INTRAMUSCULAR
  Filled 2017-09-25: qty 0.5

## 2017-09-25 NOTE — Discharge Instructions (Signed)
Laceration, Sutures    You have been treated for a laceration (cut).    Follow up with your doctor OR come back here OR go to the nearest Emergency Department to have your sutures (stitches) taken out. Sutures should be taken out in:   10 days.    Use the following wound care instructions:   Keep the wound clean and dry for the next 24 hours. You can wash the wound gently with soap and water.   DO NOT allow your wound to soak in water (don't do the dishes or go swimming, for example). You can shower, but do not rub your stitches too hard. Let the wound dry before putting another bandage on.   Take off old dressings every day. Then put on a clean, dry dressing.   If the bandage sticks to the wound, slightly moisten it with water. This way, it can come off more easily.   You can wash the wound gently with soap and water. To help remove a scab, cleanse the area with a mixture of half hydrogen peroxide and half water. This will also help us to take out the sutures later.   Allow the area to dry completely before putting on a new bandage.   Unless you receive instructions not to do so, you can place a thin layer of antibiotic ointment over the wound. You can buy Polysporin, Bacitracin, or Neosporin at the store. Neosporin can sometimes cause irritation to your skin. If this happens, stop using it and switch to another topical (surface) antibiotic.   If needed, put a clean, dry bandage over the wound to protect it.    Keep the affected area elevated (lifted) for the next 24 hours. This will decrease swelling and pain. You may also want to put ice on the area. By applying ice to the affected area, swelling and pain can be reduced. Place some ice cubes in a re-sealable (Ziploc) bag and add some water. Put a thin washcloth between the bag and the skin. Apply the ice bag to the area for at least 20 minutes. Do this at least 4 times per day. It is OK to use the ice more frequently and for longer periods of  time. DO NOT APPLY ICE DIRECTLY TO THE SKIN!    If you had a local anesthetic, it will wear off in about 2 hours. Until then, be careful not to hurt yourself because of having less feeling in the area.    YOU SHOULD SEEK MEDICAL ATTENTION IMMEDIATELY, EITHER HERE OR AT THE NEAREST EMERGENCY DEPARTMENT, IF ANY OF THE FOLLOWING OCCURS:   You see redness or swelling.   There are red streaks going up from the injured area.   The wound smells bad or has a lot of drainage.   You have fever (temperature higher than 100.4F / 38C), chills, worse pain and / or swelling.

## 2017-09-25 NOTE — ED Provider Notes (Signed)
EMERGENCY DEPARTMENT HISTORY AND PHYSICAL EXAM     Physician/Midlevel provider first contact with patient: 09/25/17 2157         Date: 09/25/2017  Patient Name: Thomas Carr  Attending Physician: Juliane Poot, MD PhD  Advanced Practice Provider: Lynita Lombard    History of Presenting Illness       History Provided By: Patient    Chief Complaint:  Chief Complaint   Patient presents with   . Laceration     Onset: Prior to arrival  Timing: while cleaning a knife  Location: laceration 2nd finger PIP  Quality: laceration  Severity:mild  Exacerbating factors: none  Alleviating factors: none  Associated Symptoms: see ROS  Pertinent Negatives: see ROS    Additional History: Thomas Carr is a 32 y.o. male presenting to the ED with no medical problems presents with 2nd finger PIP laceration.  Patient was cleaning a knife.  He cut himself.  Bleeding controlled.  Last tetanus unknown.  Can move it but it hurts no fb sensation    PCP: Pcp, Noneorunknown, MD  SPECIALISTS:     Edwards, Mindel   Home Medication Instructions [id number]:[id number]    Printed on:09/26/17 0350   Medication Information 08 AM 10 AM 12 Noon 06 PM 08 PM 10 PM Bed time             Fexofenadine HCl (ALLEGRA PO)  Take by mouth                Past History     Past Medical History:  History reviewed. No pertinent past medical history.    Past Surgical History:  History reviewed. No pertinent surgical history.    Family History:  History reviewed. No pertinent family history.    Social History:  Social History     Social History   . Marital status: Single     Spouse name: N/A   . Number of children: N/A   . Years of education: N/A     Social History Main Topics   . Smoking status: Never Smoker   . Smokeless tobacco: Never Used   . Alcohol use Yes   . Drug use: No   . Sexual activity: Not Asked     Other Topics Concern   . None     Social History Narrative   . None       Allergies:  No Known Allergies    Review of Systems       General: No fever.  no falls. no trauma. No LOC.   Head/Neck: No head injury, no headache, no neck pain.  Cardiovascular: No chest pain, no shortness of breath.   Gastrointestinal: No abdominal pain, no nausea, no vomiting.  Musculoskeletal: +finger pain, no back pain, no other joint pains.    Neurological: No new focal weakness, no sensory changes.   Dermatological: No new rash, +lacerations, no abrasions.   Psychological: No acute mood changes, no confusion.     Review of Systems    Physical Exam     Vitals:    09/25/17 2121   BP: 123/68   Pulse: 71   Resp: 17   Temp: 97.6 F (36.4 C)   SpO2: 99%   Weight: 68 kg   Height: 5\' 8"  (1.727 m)         Constitutional: Vital signs reviewed. Well appearing, no apparent distress.    Head: Normocephalic, atraumatic. No external trauma noted.  Eyes: Conjunctiva and sclera are  normal. Pupils equal, round and reactive. EOMI.   Ear, Nose, Throat:  Normal external examination of the nose and ears. Oropharynx clear, moist mucous membranes.   Neck: Supple. Trachea midline.  No cervical midline tenderness.   Respiratory: No respiratory distress. No wheezing, no rhonchi, and no rales.  Chest: No chest wall tenderness or crepitus.   Cardiovascular: Regular rate. Regular rhythm. S1, S2.   Abdomen: Normoactive Bowel sounds. Soft. Non-tender to palpation. No guarding or rebound.   Back: No midline tenderness, no CVA tenderness. No evidence of trauma.   Skin: Warm and dry. +No lacerations. noNo abrasions.   Neuro: alert and appropriate, normal speech, no facial droop, upper and lower extremities with 5/5 strength, normal sensation, normal gait.      Physical Exam   Musculoskeletal:        Hands:      Diagnostic Study Results     Labs -     Results     ** No results found for the last 24 hours. **          Radiologic Studies -   Radiology Results (24 Hour)     Procedure Component Value Units Date/Time    Hand Right PA Lateral and Oblique [[id number]] Collected:  09/25/17 2242    Order Status:  Completed  Updated:  09/25/17 2248    Narrative:       XR HAND RIGHT PA LATERAL AND OBLIQUE 09/25/2017 10:24 PM    Clinical history: laceration    Comparison: None available.        Impression:       Findings/impression: Irregularity in the soft tissues of the second  digit adjacent to the second proximal interphalangeal joint may  represent a small laceration. There is no radiopaque foreign body or gas  in the soft tissues. The osseous structures, joint spaces have a normal  radiographic appearance. There is no evidence of acute fractures or  dislocations.    Joselyn Glassman, MD   09/25/2017 10:44 PM      .    Medical Decision Making   I am the first provider for this patient.    I reviewed the vital signs, available nursing notes, past medical history, past surgical history, family history and social history.    Vital Signs-Reviewed the patient's vital signs.     Patient Vitals for the past 12 hrs:   BP Temp Pulse Resp   09/25/17 2121 123/68 97.6 F (36.4 C) 71 17       Pulse Oximetry Analysis - Normal 99% on RA       Procedures:   Procedures  ------------------- PROCEDURE: LACERATION REPAIR  --------------------    Performed by the emergency provider  Consent:  Informed consent, after discussion of the risks, benefits, and alternatives to the procedure, was obtained  Location:  right 2nd finger PIP  Length: 2 cm  Complexity: Simple flap  Description: clean wound edges, no foreign bodies  Distal CMS:  Normal.  No deficits.  Neurovascularly intact.  Anesthesia: Lidocaine 1% without epinephrine  Preparation: The wound was cleaned with betadine solution and irrigated with saline.  The area was prepped and draped in the usual sterile fashion.   Exploration:   The wound was explored and no foreign bodies were found.  Procedure: The skin was closed with 5-0 prolene.  There was good approximation.  In total, ?5 were used.  Post-Procedure:  Good closure and hemostasis.  The patient tolerated the procedure well and there  were no  complications.  CSM remains intact.  Post procedure dressing applied by RN.      Old Medical Records: Old medical records.     Provider Notes/Medical Decision Making:     Patient presents with laceration.  No evidence of tendon injury.  Patient neurovascularly intact.  X-ray was negative.  Laceration repair in the emergency room.  Follow-up in 10 days for suture removal  Tetanus updated.     There appears to be no other apparent injury/pain and patient is currently stable. Discussed reasons for return including but not limited to: worsening pain, swelling, fever, numbness or weakness.     D/w Juliane Poot, MD PhD    Discharge Prescriptions     None            Diagnosis     Clinical Impression:   1. Laceration of right index finger without foreign body without damage to nail, initial encounter    2. Need for tetanus booster        Treatment Plan:   ED Disposition     ED Disposition Condition Date/Time Comment    Discharge  Mon Sep 25, 2017 11:45 PM Thomas Carr discharge to home/self care.    Condition at disposition: Stable            _______________________________    CHART OWNERSHIPNigel Sloop, PA-C, am the primary clinician of record.  _______________________________       Lynita Lombard, PA  09/26/17 2952       Juliane Poot, MD PhD  09/26/17 [phone]

## 2017-09-25 NOTE — ED Triage Notes (Signed)
Pt cut 2nd finger on right hand with a knife.  Laceration noted with bleeding controlled

## 2018-03-27 DIAGNOSIS — R05 Cough: Secondary | ICD-10-CM | POA: Insufficient documentation

## 2018-03-27 NOTE — ED Triage Notes (Signed)
Thomas Carr is a 32 y.o. male presenting to the ED with a dry cough for a few days, and generalized weakness. Has been prescribed benzonate, codeine-guainifecin,  Zyrtec D.     BP 131/73   Pulse (!) 105   Temp 97.7 F (36.5 C) (Oral)   Resp 18   Ht 5\' 8"  (1.727 m)   Wt 73.5 kg   SpO2 97%   BMI 24.63 kg/m

## 2018-03-28 ENCOUNTER — Emergency Department: Payer: BC Managed Care – PPO

## 2018-03-28 ENCOUNTER — Emergency Department
Admission: EM | Admit: 2018-03-28 | Discharge: 2018-03-28 | Disposition: A | Discharge status: Home / Self Care | Payer: BC Managed Care – PPO | Attending: Emergency Medicine | Admitting: Emergency Medicine

## 2018-03-28 DIAGNOSIS — R059 Cough, unspecified: Secondary | ICD-10-CM

## 2018-03-28 LAB — ECG 12-LEAD
Atrial Rate: 101 {beats}/min
P Axis: 69 degrees
P-R Interval: 138 ms
Q-T Interval: 308 ms
QRS Duration: 72 ms
QTC Calculation (Bezet): 399 ms
R Axis: 53 degrees
T Axis: 46 degrees
Ventricular Rate: 101 {beats}/min

## 2018-03-28 MED ORDER — ALBUTEROL SULFATE HFA 108 (90 BASE) MCG/ACT IN AERS
2.0000 | INHALATION_SPRAY | RESPIRATORY_TRACT | 0 refills | Status: AC | PRN
Start: 2018-03-28 — End: 2018-04-27

## 2018-03-28 MED ORDER — AZITHROMYCIN 500 MG PO TABS
500.00 mg | ORAL_TABLET | Freq: Every day | ORAL | 0 refills | Status: AC
Start: 2018-03-28 — End: 2018-04-02

## 2018-03-28 NOTE — Discharge Instructions (Signed)
Cough    You have been seen for your cough.    There are many possible causes of cough. Most are not dangerous. Your doctor has determined that it is OK for you to go home today.    Your doctor believes your cough was caused by bacteria. Your doctor prescribed an antibiotic that will fight the bacteria.    The doctor may have prescribed some medicine to help with your cough. Use the medicine as directed.    YOU SHOULD SEEK MEDICAL ATTENTION IMMEDIATELY, EITHER HERE OR AT THE NEAREST EMERGENCY DEPARTMENT, IF ANY OF THE FOLLOWING OCCURS:   You wheeze or have trouble breathing.   You cough up mucous or lose weight for no reason.   You have a fever (temperature higher than 100.4F / 38C) that lasts more than 5 days.   You have chest pain.   Your symptoms get worse or do not get better in 2 or 3 days.   You have any new problems or concerns.

## 2018-03-28 NOTE — ED Notes (Signed)
Bed: EX29  Expected date:   Expected time:   Means of arrival:   Comments:

## 2018-03-28 NOTE — ED Provider Notes (Signed)
EMERGENCY DEPARTMENT HISTORY AND PHYSICAL EXAM     Physician/Midlevel provider first contact with patient: 03/28/18 0055         Date: 03/28/2018  Patient Name: Thomas Carr    History of Presenting Illness     Chief Complaint   Patient presents with   . Cough       History Provided By: Pt    Chief Complaint: Cough  Duration: Ten days  Timing: Intermittent; worsening over the past several days  Location: Respiratory  Quality: Productive  Severity: Moderate  Exacerbating Factors: Laying in bed at night   Alleviating Factors: None  Associated Symptoms: Congestion, HA, back pain  Pertinent Negatives: Fevers, chills    Additional History: Thomas Carr is a 32 y.o. male presenting to the ED with a productive cough (clear sputum) for the past ten days that has been worsening over the past several days. He went to an Urgent Care last week and was given Robitussin D, Zyrtec D, Flonase, and Tessalon Perles without improvement of sxs - pt states his cough has been worsening. Pt has not had a CXR done with this cough. He also notes having some nasal congestion. Pt also notes having intermittent back pain with his cough and also has a HA. Denies fevers or chills.     PCP: Pcp, None, MD    No current facility-administered medications for this encounter.      Current Outpatient Medications   Medication Sig Dispense Refill   . albuterol (PROVENTIL HFA;VENTOLIN HFA) 108 (90 Base) MCG/ACT inhaler Inhale 2 puffs into the lungs every 4 (four) hours as needed for Wheezing or Shortness of Breath (coughing) Dispense with spacer 1 Inhaler 0   . azithromycin (ZITHROMAX) 500 MG tablet Take 1 tablet (500 mg total) by mouth daily for 5 days 5 tablet 0   . Fexofenadine HCl (ALLEGRA PO) Take by mouth         Past History     Past Medical History:  No past medical history on file.    Past Surgical History:  No past surgical history on file.    Family History:  No family history on file.    Social History:  Social History     Tobacco  Use   . Smoking status: Never Smoker   . Smokeless tobacco: Never Used   Substance Use Topics   . Alcohol use: Yes   . Drug use: No       Allergies:  No Known Allergies    Review of Systems     Review of Systems   Constitutional: Negative for chills, diaphoresis, fatigue and fever.   HENT: Positive for congestion. Negative for ear discharge, ear pain, facial swelling, nosebleeds, postnasal drip, rhinorrhea, sinus pressure, sinus pain and sore throat.    Respiratory: Positive for cough. Negative for apnea, chest tightness, shortness of breath, wheezing and stridor.    Cardiovascular: Negative for chest pain, palpitations and leg swelling.   Gastrointestinal: Negative.    Genitourinary: Negative.    Musculoskeletal: Positive for back pain.   Neurological: Positive for headaches.   All other systems reviewed and are negative.      Physical Exam   BP 131/73   Pulse (!) 105   Temp 97.7 F (36.5 C) (Oral)   Resp 18   Ht 5\' 8"  (1.727 m)   Wt 73.5 kg   SpO2 97%   BMI 24.63 kg/m     Physical Exam  Vitals signs and nursing  note reviewed.   Constitutional:       Appearance: Normal appearance.   HENT:      Head: Normocephalic and atraumatic.      Comments: No sinus pressure     Nose: Congestion present. No rhinorrhea.      Mouth/Throat:      Mouth: Mucous membranes are moist.      Pharynx: Oropharynx is clear.   Neck:      Musculoskeletal: Normal range of motion and neck supple. No neck rigidity.   Cardiovascular:      Rate and Rhythm: Normal rate and regular rhythm.      Pulses: Normal pulses.      Heart sounds: Normal heart sounds.   Pulmonary:      Effort: Pulmonary effort is normal.      Breath sounds: Normal breath sounds.   Abdominal:      Palpations: Abdomen is soft.      Tenderness: There is no tenderness.   Musculoskeletal: Normal range of motion.         General: No swelling or tenderness.   Skin:     General: Skin is warm and dry.      Capillary Refill: Capillary refill takes less than 2 seconds.    Neurological:      General: No focal deficit present.      Mental Status: He is alert and oriented to person, place, and time.   Psychiatric:         Mood and Affect: Mood normal.         Behavior: Behavior normal.           Diagnostic Study Results     Labs -     Results     ** No results found for the last 24 hours. **          Radiologic Studies -   Radiology Results (24 Hour)     Procedure Component Value Units Date/Time    XR Chest 2 Views [[id number]] Collected:  03/28/18 4098    Order Status:  Completed Updated:  03/28/18 0213    Narrative:       Examination: Frontal lateral chest.    HISTORY: Cough.    COMPARISON: None.    FINDINGS:  Lungs, costophrenic angles clear.  Heart size normal.  Bones no acute process.      Impression:         Normal.    Adaline Sill, MD   03/28/2018 2:09 AM      .      Medical Decision Making   I am the first provider for this patient.    I reviewed the vital signs, available nursing notes, past medical history, past surgical history, family history and social history.    Vital Signs-Reviewed the patient's vital signs.     Patient Vitals for the past 12 hrs:   BP Temp Pulse Resp   03/27/18 2336 131/73 97.7 F (36.5 C) (!) 105 18       Pulse Oximetry Analysis - Normal, 97% on RA    Old Medical Records: Nursing notes.     ED Course:     1:22 AM - Pt agreeable to ED plan, including having CXR performed.     2:05 AM -  Awaiting CXR read from Radiology.    2:14 AM - Discussed all results with pt and counseled on diagnosis, f/u plans, medication use, and signs and symptoms when to return to ED.  Pt  is stable and ready for discharge.       Provider Notes  Well appearing pt with 10 days of cough with  No associated sinus pressure, cough worsening per pt. Pt has been using guafenesin with codeine, tessalon perles, and flonase without improvement. CXR negative, however given duration of symptoms and the fact that they are worsening, will give course of abx. Pt also discharged with rx for  albuterol inhaler and information on finding a PCP. Pt understands and agrees with plan, will return for new or worsening symptoms.        Diagnosis     Clinical Impression:   1. Cough        Treatment Plan:   ED Disposition     ED Disposition Condition Date/Time Comment    Discharge  Wed Mar 28, 2018  2:14 AM Thomas Carr discharge to home/self care.    Condition at disposition: Stable        _______________________________    Attestations:   This note is prepared by Marcille Blanco, acting as Scribe for Elton Sin, MD.    Elton Sin, MD:  The scribe's documentation has been prepared under my direction and personally reviewed by me in its entirety.  I confirm that the note above accurately reflects all work, treatment, procedures, and medical decision making performed by me.    _______________________________             Leary Roca, MD  03/28/18 0222

## 2018-05-22 ENCOUNTER — Encounter (INDEPENDENT_AMBULATORY_CARE_PROVIDER_SITE_OTHER): Payer: Self-pay | Admitting: Internal Medicine

## 2018-05-22 ENCOUNTER — Ambulatory Visit (INDEPENDENT_AMBULATORY_CARE_PROVIDER_SITE_OTHER): Payer: BC Managed Care – PPO | Admitting: Internal Medicine

## 2018-05-22 VITALS — BP 117/74 | HR 83 | Temp 98.1°F | Resp 16 | Ht 68.0 in | Wt 166.6 lb

## 2018-05-22 DIAGNOSIS — Z13220 Encounter for screening for lipoid disorders: Secondary | ICD-10-CM | POA: Insufficient documentation

## 2018-05-22 DIAGNOSIS — L659 Nonscarring hair loss, unspecified: Secondary | ICD-10-CM

## 2018-05-22 DIAGNOSIS — Z23 Encounter for immunization: Secondary | ICD-10-CM | POA: Insufficient documentation

## 2018-05-22 DIAGNOSIS — L309 Dermatitis, unspecified: Secondary | ICD-10-CM

## 2018-05-22 DIAGNOSIS — Z113 Encounter for screening for infections with a predominantly sexual mode of transmission: Secondary | ICD-10-CM

## 2018-05-22 DIAGNOSIS — G44039 Episodic paroxysmal hemicrania, not intractable: Secondary | ICD-10-CM

## 2018-05-22 DIAGNOSIS — Z0000 Encounter for general adult medical examination without abnormal findings: Secondary | ICD-10-CM

## 2018-05-22 DIAGNOSIS — Z1329 Encounter for screening for other suspected endocrine disorder: Secondary | ICD-10-CM

## 2018-05-22 DIAGNOSIS — R05 Cough: Secondary | ICD-10-CM

## 2018-05-22 DIAGNOSIS — R053 Chronic cough: Secondary | ICD-10-CM

## 2018-05-22 DIAGNOSIS — Z0001 Encounter for general adult medical examination with abnormal findings: Secondary | ICD-10-CM

## 2018-05-22 DIAGNOSIS — Z131 Encounter for screening for diabetes mellitus: Secondary | ICD-10-CM

## 2018-05-22 LAB — COMPREHENSIVE METABOLIC PANEL
ALT: 44 U/L (ref 0–55)
AST (SGOT): 21 U/L (ref 5–34)
Albumin/Globulin Ratio: 1.4 (ref 0.9–2.2)
Albumin: 4.2 g/dL (ref 3.5–5.0)
Alkaline Phosphatase: 38 U/L (ref 38–106)
BUN: 14 mg/dL (ref 9.0–28.0)
Bilirubin, Total: 0.6 mg/dL (ref 0.2–1.2)
CO2: 26 mEq/L (ref 21–29)
Calcium: 9.7 mg/dL (ref 8.5–10.5)
Chloride: 104 mEq/L (ref 100–111)
Creatinine: 0.9 mg/dL (ref 0.5–1.5)
Globulin: 2.9 g/dL (ref 2.0–3.7)
Glucose: 79 mg/dL (ref 70–100)
Potassium: 3.9 mEq/L (ref 3.5–5.1)
Protein, Total: 7.1 g/dL (ref 6.0–8.3)
Sodium: 139 mEq/L (ref 136–145)

## 2018-05-22 LAB — CBC AND DIFFERENTIAL
Absolute NRBC: 0 10*3/uL (ref 0.00–0.00)
Basophils Absolute Automated: 0.01 10*3/uL (ref 0.00–0.08)
Basophils Automated: 0.2 %
Eosinophils Absolute Automated: 0.09 10*3/uL (ref 0.00–0.44)
Eosinophils Automated: 1.9 %
Hematocrit: 45.8 % (ref 37.6–49.6)
Hgb: 16.1 g/dL (ref 12.5–17.1)
Immature Granulocytes Absolute: 0.01 10*3/uL (ref 0.00–0.07)
Immature Granulocytes: 0.2 %
Lymphocytes Absolute Automated: 1.9 10*3/uL (ref 0.42–3.22)
Lymphocytes Automated: 40.9 %
MCH: 29.3 pg (ref 25.1–33.5)
MCHC: 35.2 g/dL (ref 31.5–35.8)
MCV: 83.4 fL (ref 78.0–96.0)
MPV: 9.8 fL (ref 8.9–12.5)
Monocytes Absolute Automated: 0.37 10*3/uL (ref 0.21–0.85)
Monocytes: 8 %
Neutrophils Absolute: 2.26 10*3/uL (ref 1.10–6.33)
Neutrophils: 48.8 %
Nucleated RBC: 0 /100 WBC (ref 0.0–0.0)
Platelets: 292 10*3/uL (ref 142–346)
RBC: 5.49 10*6/uL (ref 4.20–5.90)
RDW: 14 % (ref 11–15)
WBC: 4.64 10*3/uL (ref 3.10–9.50)

## 2018-05-22 LAB — HEPATITIS PANEL, ACUTE
Hep A IgM: NONREACTIVE
Hepatitis B Core IgM: NONREACTIVE
Hepatitis B Surface Antigen: NONREACTIVE
Hepatitis C, AB: NONREACTIVE

## 2018-05-22 LAB — SYPHILIS SCREEN IGG AND IGM: Syphilis Screen IgG and IgM: NONREACTIVE

## 2018-05-22 LAB — LIPID PANEL
Cholesterol / HDL Ratio: 3.4
Cholesterol: 179 mg/dL (ref 0–199)
HDL: 52 mg/dL (ref 40–9999)
LDL Calculated: 114 mg/dL — ABNORMAL HIGH (ref 0–99)
Triglycerides: 64 mg/dL (ref 34–149)
VLDL Calculated: 13 mg/dL (ref 10–40)

## 2018-05-22 LAB — TSH: TSH: 1.36 u[IU]/mL (ref 0.35–4.94)

## 2018-05-22 LAB — HEMOLYSIS INDEX: Hemolysis Index: 8 (ref 0–18)

## 2018-05-22 LAB — GFR: EGFR: >60

## 2018-05-22 LAB — HIV AG/AB 4TH GENERATION: HIV Ag/Ab, 4th Generation: NONREACTIVE

## 2018-05-22 MED ORDER — AZELASTINE HCL 0.1 % NA SOLN
2.00 | Freq: Two times a day (BID) | NASAL | 11 refills | Status: DC
Start: 2018-05-22 — End: 2018-11-16

## 2018-05-22 NOTE — Progress Notes (Signed)
Verified-Flu Vaccine-XH-[facility]

## 2018-05-22 NOTE — Progress Notes (Signed)
Subjective:      Thomas Carr is a 32 y.o. male who presents for evaluation of headache. Symptoms began about 2 months ago. Generally, the headaches last about 1 hour and occur several times per week. The headaches do not seem to be related to any time of the day. The headaches are usually moderate, dull and squeezing and are located in frontal area and ehtmoid areas.  The patient rates his most severe headaches a 7 on a scale from 1 to 10. Recently, the headaches have been increasing in both severity and frequency. Work attendance or other daily activities are affected by the headaches. Precipitating factors include: stress, URI symptoms and weather changes. The headaches are usually not preceded by an aura. Associated neurologic symptoms: none. The patient denies decreased physical activity, depression, dizziness, loss of balance, muscle weakness, numbness of extremities, speech difficulties, vision problems, vomiting in the early morning and worsening school/work performance. Home treatment has included nothing with no improvement. Other history includes: migraine headaches diagnosed in the past, allergic rhinitis and family history of migraines. Family history includes migraine headaches in patient, mother and father.    Cough  Patient complains of nasal congestion and nonproductive cough. Symptoms began 2 months ago. Symptoms have been unchanged since that time.The cough is dry and paroxysmal and is aggravated by nothing. Associated symptoms include: change in voice and postnasal drip. Patient does not have new pets. Patient does not have a history of asthma. Patient does have a history of environmental allergens. Patient has not traveled recently. Patient does not have a history of smoking. Patient has not had a previous chest x-ray. Patient has not had a PPD done.    The following portions of the patient's history were reviewed and updated as appropriate: allergies, current medications, past family  history, past medical history, past social history, past surgical history and problem list.    Review of Systems  Pertinent items are noted in HPI.      Objective:      BP 117/74 (BP Site: Right arm, Patient Position: Sitting, Cuff Size: Medium)   Pulse 83   Temp 98.1 F (36.7 C) (Oral)   Resp 16   Ht 1.727 m (5\' 8" )   Wt 75.6 kg (166 lb 9.6 oz)   BMI 25.33 kg/m   General appearance: alert, appears stated age and cooperative  Head: Normocephalic, without obvious abnormality, atraumatic  Nose: Nares normal. Septum midline. Mucosa normal. No drainage or sinus tenderness., clear discharge, moderate congestion, turbinates swollen, edematous  Throat: lips, mucosa, and tongue normal; teeth and gums normal  Neck: no adenopathy, no carotid bruit, no JVD, supple, symmetrical, trachea midline and thyroid not enlarged, symmetric, no tenderness/mass/nodules  Lungs: clear to auscultation bilaterally  Heart: regular rate and rhythm, S1, S2 normal, no murmur, click, rub or gallop      Assessment:      Classic migraine  Sinusitis, chronic      Plan:      Lie in darkened room and apply cold packs as needed for pain.  Side effect profile discussed in detail.  Asked to keep headache diary.  Patient reassured that neurodiagnostic workup not indicated from benign H&P.  Neurodiagnostic workup: MRI to r/o tumor or mass.  Referral to Neurology.  Referral to ENT.

## 2018-05-22 NOTE — Patient Instructions (Signed)
Allergic Rhinitis  Allergic rhinitis is an allergic reaction that affects the nose, and often the eyes. It's often known asnasal allergies. Nasal allergies are often due to things in the environment that are breathed in. Depending what you are sensitive to, nasal allergies may occur only during certain seasons or they may occur year round. Common indoor allergens include house dust mites, mold, cockroaches, and pet dander. Outdoor allergens include pollen from trees, grasses, and weeds.  Symptoms include a drippy, stuffy, and itchy nose. They also include sneezing and red and itchy eyes. You may feel tired more often. Severe allergies may also affect your breathing and trigger a condition called asthma.  Tests can be done to see what allergens are affecting you. You may be referred to an allergy specialist for testing and further evaluation.  Home care  Your healthcare provider may prescribe medicines to help relieve allergy symptoms. These may include oral medicines, nasal sprays, or eye drops.  Ask your provider for advice on how to avoid substances that you are allergic to.Below are a few tips for each type of allergen.  Pet dander:   Do not have pets with fur and feathers.   If you can't avoid having a pet, keep it out of your bedroom and off upholstered furniture.  Pollen:   When pollen counts are high, keep windows of your car and home closed. If possible, use an air conditioner instead.   Wear a filter mask when mowing or doing yard work.  House dust mites:   Wash bedding every week in warm water and detergent and dry on a hot setting.   Cover the mattress, box spring, and pillows with allergy covers.   If possible, sleep in a room with no carpet, curtains, or upholstered furniture.  Cockroaches:   Store food in sealed containers.   Remove garbage from the home promptly.   Fix water leaks  Mold:   Keep humidity low by using a dehumidifier or air conditioner. Keep the dehumidifier and air  conditioner clean and free of mold.   Clean moldy areas with bleach and water(do not mix with other cleaners) .  In general:   Vacuum once or twice a week. If possible, use a vacuum with a high-efficiency particulate air (HEPA) filter.   Do not smoke. Avoid cigarette smoke. Cigarette smoke is an irritant that can make symptoms worse.  Follow-up care  Follow up as advised by the healthcare provider or our staff. If you were referred to an allergy specialist, make this appointment promptly.  When to seek medical advice  Call your healthcare provider right away or seek immediate medical attention if the following occur:   Coughing or wheezing   Fever of 100.4F (38C) or higher, or as directed by your healthcare provider   Raised red bumps (hives)   Continuing symptoms, new symptoms, or worsening symptoms  Call 911if you have:   Trouble breathing   Severe swelling of the face or severe itching of the eyes or mouth  [employer] last reviewed this educational content on 08/05/2015   2000-2019 [employer] [address], [place] [address]. All rights reserved. This information is not intended as a substitute for professional medical care. Always follow your healthcare professional's instructions.

## 2018-05-22 NOTE — Progress Notes (Signed)
Subjective:      Date: 05/22/2018 11:05 AM   Patient ID: Thomas Carr is a 32 y.o. male.    Chief Complaint:  Chief Complaint   Patient presents with   . Annual Exam       HPI  Visit Type: Health Maintenance Visit  Work Status: working full-time  Reported Health: good health  Reported Diet: compliant with well-balanced diet, less fried foods and changed to diet drinks  Reported Exercise: none and regularly  Dental: dentist visit > 1 year ago  Vision: no vision correction needed  Hearing: normal hearing  Immunization Status: immunizations up to date and Influenza vaccination due  Reproductive Health: sexually active and History of STD: negative  Prior Screening Tests: no prior PSA testing, no previous colorectal cancer screening and no previous dexa scan  General Health Risks: no family history of prostate cancer, no family history of colon cancer and no family history of breast cancer  Safety Elements Used: uses seat belts, smoke detectors in household, carbon monoxide detectors in household, sunscreen use, does not text and drive and no guns at home    Problem List:  Patient Active Problem List   Diagnosis   . Encounter for screening for lipid disorder   . Need for vaccination       Current Medications:  Current Outpatient Medications   Medication Sig Dispense Refill   . Acetaminophen (TYLENOL PO) Take by mouth     . Fexofenadine HCl (ALLEGRA PO) Take by mouth     . Pseudoephedrine-DM-GG (SUDAFED COUGH PO) Take by mouth       No current facility-administered medications for this visit.        Allergies:  No Known Allergies    Past Medical History:  Past Medical History:   Diagnosis Date   . Seasonal allergic rhinitis        Past Surgical History:  History reviewed. No pertinent surgical history.    Family History:  Family History   Problem Relation Age of Onset   . Hypertension Mother    . Diabetes Father    . Other Father         Cardiac Arrest       Social History:  Social History     Socioeconomic History    . Marital status: Single     Spouse name: Not on file   . Number of children: Not on file   . Years of education: Not on file   . Highest education level: Not on file   Occupational History   . Not on file   Social Needs   . Financial resource strain: Not on file   . Food insecurity:     Worry: Not on file     Inability: Not on file   . Transportation needs:     Medical: Not on file     Non-medical: Not on file   Tobacco Use   . Smoking status: Never Smoker   . Smokeless tobacco: Never Used   Substance and Sexual Activity   . Alcohol use: Yes   . Drug use: No   . Sexual activity: Not on file   Lifestyle   . Physical activity:     Days per week: Not on file     Minutes per session: Not on file   . Stress: Not on file   Relationships   . Social connections:     Talks on phone: Not on file  Gets together: Not on file     Attends religious service: Not on file     Active member of club or organization: Not on file     Attends meetings of clubs or organizations: Not on file     Relationship status: Not on file   . Intimate partner violence:     Fear of current or ex partner: Not on file     Emotionally abused: Not on file     Physically abused: Not on file     Forced sexual activity: Not on file   Other Topics Concern   . Not on file   Social History Narrative   . Not on file       The following sections were reviewed this encounter by the provider:        Review of Systems    Objective:     Vitals:  BP 117/74 (BP Site: Right arm, Patient Position: Sitting, Cuff Size: Medium)   Pulse 83   Temp 98.1 F (36.7 C) (Oral)   Resp 16   Ht 1.727 m (5\' 8" )   Wt 75.6 kg (166 lb 9.6 oz)   BMI 25.33 kg/m   BP Readings from Last 3 Encounters:   05/22/18 117/74   03/27/18 131/73   09/25/17 123/68       Examination:   General Examination:   GENERAL APPEARANCE: alert, in no acute distress, well developed, well nourished, oriented to time, place, and person.   HEAD: normal appearance, atraumatic.   EYES: extraocular movement  intact (EOMI), pupils equal, round, reactive to light and accommodation, sclera anicteric, conjunctiva clear.   EARS: tympanic membranes normal bilaterally, external canals normal .   NOSE: normal nasal mucosa, no lesions.   ORAL CAVITY: normal oropharynx, normal lips, mucosa moist, no lesions.   THROAT: normal appearance, clear, no erythema.   NECK/THYROID: neck supple, no carotid bruit, carotid pulse 2+ bilaterally, no cervical lymphadenopathy, no neck mass palpated, no jugular venous distention, no thyromegaly.   LYMPH NODES: no palpable adenopathy.   SKIN: good turgor, no rashes, no suspicious lesions.   HEART: S1, S2 normal, no murmurs, rubs, gallops, regular rate and rhythm.   LUNGS: normal effort / no distress, normal breath sounds, clear to auscultation bilaterally, no wheezes, rales, rhonchi.   ABDOMEN: bowel sounds present, no hepatosplenomegaly, soft, nontender, nondistended.   RECTAL: normal tone, no masses palpable, prostate normal.   MALE GENITOURINARY: no penile lesions or discharge, no testicular mass.   MUSCULOSKELETAL: full range of motion, no swelling or deformity.   EXTREMITIES: no edema, no clubbing, cyanosis, or edema.   PERIPHERAL PULSES: 2+ dorsalis pedis, 2+ posterior tibial.   NEUROLOGIC: nonfocal, cranial nerves 2-12 grossly intact, deep tendon reflexes 2+ symmetrical, normal strength, tone and reflexes, sensory exam intact.   PSYCH: cognitive function intact, mood/affect full range, speech clear.     No results found for: WBC, HGB, HCT, PLT, CHOL, TRIG, HDL, LDL, ALT, AST, NA, K, CL, CREAT, BUN, CO2, TSH, PSA, INR, GLU, HGBA1C  Assessment/Plan:       1. Physical exam, annual    2. Screening for diabetes mellitus    3. Encounter for screening for lipid disorder    4. Need for vaccination      Check fasting blood work as above  Continue to work on improved lifestyle modifications (eating healthy/exercise) to help with medical management of problems  Risks & Benefits of any new or previous  medication(s) were explained to the  patient who verbalized understanding & agreed to the treatment plan  Follow up with all specialists as recommended/scheduled    HTN:  Current blood pressure is adequately controlled on the current medication regimen. Current blood pressure is at goal. Continue to optimize low salt diet and aerobic exercise efforts. High BMI follow-up: Encouragement to Exercise. Recommend out-of-office blood pressure measurements to confirm diagnosis of HTN.    Health Maintenance:  High BMI follow-up: Encouragement to Exercise. Recommend optimizing low carbohydrate diet efforts and obtaining at least 150 minutes of aerobic exercise per week. Recommend 20-25 grams of dietary fiber daily. Recommend drinking at least 60-80 ounces of water per day. Recommend optimizing low sodium diet measures ( less than 2 grams of sodium in the diet per day ). Obtain a nutrition consult to help individualize dietary recommendations to help achieve glycemic and/or lipid goals Obtain a naturopathic consult to help individualize dietary and supplement  recommendations to help achieve glycemic and/or lipid goalsImmunizations UTD. Vision screening UTD. Dental Screening UTD. Dermatology screening is due.  Recommend dermatology consult at earliest convenience.   Herbert Deaner, MD

## 2018-05-22 NOTE — Progress Notes (Signed)
Have you seen any specialists/other providers since your last visit with us?    No    Arm preference verified?   Yes    The patient is due for Influenza Vaccine

## 2018-05-23 LAB — HEMOGLOBIN A1C
Average Estimated Glucose: 91.1 mg/dL
Hemoglobin A1C: 4.8 % (ref 4.6–5.9)

## 2018-05-24 LAB — HSV TYPE 1 AND 2 IGG: HSV 1 IgG Type-Specific AB: <0.9 (ref <0.90)

## 2018-05-24 LAB — HSV TYPE 1 AND 2 ANTIBODY IGG: HSV 2 IgG Type-Specific Antibody: <0.9 (ref <0.90)

## 2018-05-26 LAB — URINE CHLAMYDIA/NEISSERIA BY PCR
Chlamydia DNA by PCR: NEGATIVE
Neisseria gonorrhoeae by PCR: NEGATIVE

## 2018-06-20 ENCOUNTER — Encounter (INDEPENDENT_AMBULATORY_CARE_PROVIDER_SITE_OTHER): Payer: Self-pay | Admitting: Internal Medicine

## 2018-11-12 ENCOUNTER — Other Ambulatory Visit: Payer: Self-pay | Admitting: Internal Medicine

## 2018-11-13 ENCOUNTER — Encounter (INDEPENDENT_AMBULATORY_CARE_PROVIDER_SITE_OTHER): Payer: Self-pay | Admitting: Internal Medicine

## 2018-11-16 ENCOUNTER — Encounter (INDEPENDENT_AMBULATORY_CARE_PROVIDER_SITE_OTHER): Payer: Self-pay | Admitting: Internal Medicine

## 2018-11-16 ENCOUNTER — Telehealth (INDEPENDENT_AMBULATORY_CARE_PROVIDER_SITE_OTHER): Payer: HMO | Admitting: Internal Medicine

## 2018-11-16 DIAGNOSIS — R05 Cough: Secondary | ICD-10-CM

## 2018-11-16 DIAGNOSIS — G44041 Chronic paroxysmal hemicrania, intractable: Secondary | ICD-10-CM

## 2018-11-16 DIAGNOSIS — J301 Allergic rhinitis due to pollen: Secondary | ICD-10-CM

## 2018-11-16 DIAGNOSIS — R053 Chronic cough: Secondary | ICD-10-CM

## 2018-11-16 MED ORDER — RIZATRIPTAN BENZOATE 10 MG PO TABS
10.00 mg | ORAL_TABLET | ORAL | 1 refills | Status: DC | PRN
Start: 2018-11-16 — End: 2019-05-23

## 2018-11-16 MED ORDER — MONTELUKAST SODIUM 10 MG PO TABS
10.00 mg | ORAL_TABLET | Freq: Every day | ORAL | 1 refills | Status: DC
Start: 2018-11-16 — End: 2019-06-21

## 2018-11-16 MED ORDER — AZELASTINE HCL 0.1 % NA SOLN
2.00 | Freq: Two times a day (BID) | NASAL | 4 refills | Status: DC
Start: 2018-11-16 — End: 2019-06-21

## 2018-11-16 NOTE — Progress Notes (Signed)
Subjective:      Patient ID: Thomas Carr is a 33 y.o. male     Chief Complaint   Patient presents with    Headache    review MRI imaging        Verbal consent has been obtained from the patient to conduct a video and telephone visit to minimize exposure to COVID-19: yes  Since our last visit he took sudafed, claritin and astelin. Now his congestion is now progressively worse with headache. He suspects he has a sinus headache.   Sinusitis   This is a chronic problem. The current episode started more than 1 month ago. The problem has been waxing and waning since onset. There has been no fever. His pain is at a severity of 6/10. The pain is moderate. Associated symptoms include congestion, coughing, headaches, sinus pressure and swollen glands. Pertinent negatives include no chills, diaphoresis, ear pain, hoarse voice, neck pain, shortness of breath, sneezing or sore throat. Past treatments include saline sprays, oral decongestants and spray decongestants. The treatment provided moderate relief.        The following sections were reviewed this encounter by the provider:   Tobacco   Med Hx   Surg Hx   Fam Hx   Soc Hx        Review of Systems   Constitutional: Negative.  Negative for chills and diaphoresis.   HENT: Positive for congestion and sinus pressure. Negative for ear pain, hoarse voice, sneezing and sore throat.    Eyes: Negative.    Respiratory: Positive for cough. Negative for shortness of breath.    Cardiovascular: Negative.    Gastrointestinal: Negative.    Endocrine: Negative.    Genitourinary: Negative.    Musculoskeletal: Negative.  Negative for neck pain.   Allergic/Immunologic: Negative.    Neurological: Positive for headaches.   Hematological: Negative.    Psychiatric/Behavioral: Negative.           There were no vitals taken for this visit.    Objective:     Physical Exam  Vitals reviewed: visual visit. limited exam.   Constitutional:       Appearance: Normal appearance. He is normal weight.    HENT:      Head: Normocephalic and atraumatic.   Neurological:      General: No focal deficit present.      Mental Status: He is alert and oriented to person, place, and time. Mental status is at baseline.   Psychiatric:         Mood and Affect: Mood normal.         Behavior: Behavior normal.         Thought Content: Thought content normal.         Judgment: Judgment normal.          Assessment:     1. Non-seasonal allergic rhinitis due to pollen  - montelukast (SINGULAIR) 10 MG tablet; Take 1 tablet (10 mg total) by mouth daily  Dispense: 90 tablet; Refill: 1    2. Chronic cough  - azelastine (ASTELIN) 0.1 % nasal spray; 2 sprays by Nasal route 2 (two) times daily 2 sprays each nostril twice a day as needed  Dispense: 30 mL; Refill: 4    3. Intractable chronic paroxysmal hemicrania  - rizatriptan (Maxalt) 10 MG tablet; Take 1 tablet (10 mg total) by mouth every 2 (two) hours as needed for Migraine Take at onset of migraine;  May repeat in 2 hrs - max dosing  20mg /24 hrs  Dispense: 9 tablet; Refill: 1        Plan:   33 yo BM with suspected Sinus Headache. Advised to stop sudafed. Start singulair. Continue with claritin, astelin. Also start Maxalt prn for presumed migraine since NSAIDS and excedrin is not effective.   Return in about 6 months (around 05/18/2019), or if symptoms worsen or fail to improve, for six months followup for physical.    Time spent in discussion: 15 minutes    Herbert Deaner, MD

## 2018-11-16 NOTE — Patient Instructions (Signed)
Migraine Headache: Stages and Treatment    A migraine headache tends to progress in stages. Learning these stages can help you better understand what is happening. Then you can learn ways to reduce pain and relieve other symptoms. Methods for relieving your symptoms include self-care and medicines.   Migraine stages  Migraines tend to progress through 4 stages. Many people don't have all stages, and stages may differ with each headache:    Prodrome. A few hours to a day or so before the headache, you may feel tired, (yawning many times),uneasy, or moody. You may also feel bloated or crave certain foods.   Aura. Up to an hour before the headache starts, some migraine sufferers experience auraflashing lights, blind spots, other vision problems, confusion,difficulty speaking, or other neurologic symptoms.   Headache. Moderate to severe pain affects one side of the head and then can spread to both sides, often along with nausea. You may be highly sensitive to light, sound, and odors. Vomiting or diarrhea may also happen. This stage lasts 4 to 72 hours.   Postdrome. After your headache ends, you may feel tired, achy, and "washed out." This may last for a day or so.  Self-care during a migraine  Here is what you can do:   Use a cold compress. Wrap a thin cloth around a cold pack, a cold can of soda, or a bag of frozen vegetables. Apply this to your temple or other pain site.   Drink fluids. If nausea makes it hard to drink, try sucking on ice.   Rest. If possible, lie down. Try not to bend over, as this may increase your pain. Sometimes laying in a dark quiet room can help the migraine from being aggravated.   Try caffeine. Some people find that drinking fluids with caffeine, such as coffee or tea, helps to lessen migraine pain.  Using medicines  Work with your healthcare provider to find the rightmedicines for you. Medicines for migraine may relieve pain (analgesics), relieve nausea, or attack the  migraine's root causes (migraine-specific medicines).   Rebound headache  Taking analgesics each day, or even several times a week, may lead to more frequent and severe headaches. These are called rebound headaches. If you think you're having rebound headaches, tell your healthcare provider. He or she can help you safely decrease your medicine. Rebound caffeine withdrawal headaches can also happen. Certain medicines are addictive and can cause rebound headaches when discontinued abruptly.   [employer] last reviewed this educational content on 10/04/2016   2000-2020 [employer], [place]. [address], [place], [place] [address]. All rights reserved. This information is not intended as a substitute for professional medical care. Always follow your healthcare professional's instructions.        Montelukast oral tablets  Brand Name: Singulair  What is this medicine?  MONTELUKAST (mon te LOO kast) is used to prevent and treat the symptoms of asthma. It is also used to treat allergies. Do not use for an acute asthma attack.  How should I use this medicine?  This medicine should be given by mouth. Follow the directions on the prescription label. Take this medicine at the same time every day. You may take this medicine with or without meals. Do not chew the tablets. Do not stop taking your medicine unless your doctor tells you to.  Talk to your pediatrician regarding the use of this medicine in children. Special care may be needed. While this drug may be prescribed for children as young  as 59 years of age for selected conditions, precautions do apply.  What side effects may I notice from receiving this medicine?  Side effects that you should report to your doctor or health care professional as soon as possible:   allergic reactions like skin rash or hives, or swelling of the face, lips, or tongue   breathing problems   changes in emotions or moods   confusion   depressed mood   fever or infection   hallucinations    joint pain   painful lumps under the skin   pain, tingling, numbness in the hands or feet   redness, blistering, peeling, or loosening of the skin, including inside the mouth   restlessness   seizures   sleep walking   signs and symptoms of infection like fever; chills; cough; sore throat; flu-like illness   signs and symptoms of liver injury like dark yellow or brown urine; general ill feeling or flu-like symptoms; light-colored stools; loss of appetite; nausea; right upper belly pain; unusually weak or tired; yellowing of the eyes or skin   sinus pain or swelling   stuttering   suicidal thoughts or other mood changes   tremors   trouble sleeping   uncontrolled muscle movements   unusual bleeding or bruising   vivid or bad dreams  Side effects that usually do not require medical attention (report to your doctor or health care professional if they continue or are bothersome):   dizziness   drowsiness   headache   runny nose   stomach upset   tiredness  What may interact with this medicine?     anti-infectives like rifampin and rifabutin   medicines for seizures like phenytoin, phenobarbital, and carbamazepine  What if I miss a dose?  If you miss a dose, take it as soon as you can. If it is almost time for your next dose, take only that dose. Do not take double or extra doses.  Where should I keep my medicine?  Keep out of the reach of children.  Store at room temperature between 15 and 30 degrees C (59 and 86 degrees F). Protect from light and moisture. Keep this medicine in the original bottle. Throw away any unused medicine after the expiration date.  What should I tell my health care provider before I take this medicine?  They need to know if you have any of these conditions:   liver disease   an unusual or allergic reaction to montelukast, other medicines, foods, dyes, or preservatives   pregnant or trying to get pregnant   breast-feeding  What should I watch for while using this  medicine?  Visit your doctor or health care professional for regular checks on your progress. Tell your doctor or health care professional if your allergy or asthma symptoms do not improve. Take your medicine even when you do not have symptoms. Do not stop taking any of your medicine(s) unless your doctor tells you to.  If you have asthma, talk to your doctor about what to do in an acute asthma attack. Always have your inhaled rescue medicine for asthma attacks with you.  Patients and their families should watch for new or worsening thoughts of suicide or depression. Also watch for sudden changes in feelings such as feeling anxious, agitated, panicky, irritable, hostile, aggressive, impulsive, severely restless, overly excited and hyperactive, or not being able to sleep. Any worsening of mood or thoughts of suicide or dying should be reported to your health care professional  right away.  NOTE:This sheet is a summary. It may not cover all possible information. If you have questions about this medicine, talk to your doctor, pharmacist, or health care provider. Copyright 2020 [employer]        Rizatriptan tablets  Brand Name: Maxalt  What is this medicine?  RIZATRIPTAN (rye za TRIP tan) is used to treat migraines with or without aura. An aura is a strange feeling or visual disturbance that warns you of an attack. It is not used to prevent migraines.  How should I use this medicine?  Take this medicine by mouth with a glass of water. Follow the directions on the prescription label. Do not take it more often than directed.  Talk to your pediatrician regarding the use of this medicine in children. While this drug may be prescribed for children as young as 6 years for selected conditions, precautions do apply.  What side effects may I notice from receiving this medicine?  Side effects that you should report to your doctor or health care professional as soon as possible:   allergic reactions like skin rash, itching or  hives, swelling of the face, lips, or tongue   chest pain or chest tightness   signs and symptoms of a dangerous change in heartbeat or heart rhythm like chest pain; dizziness; fast, irregular heartbeat; palpitations; feeling faint or lightheaded; falls; breathing problems   signs and symptoms of a stroke like changes in vision; confusion; trouble speaking or understanding; severe headaches; sudden numbness or weakness of the face, arm or leg; trouble walking; dizziness; loss of balance or coordination   signs and symptoms of serotonin syndrome like irritable; confusion; diarrhea; fast or irregular heartbeat; muscle twitching; stiff muscles; trouble walking; sweating; high fever; seizures; chills; vomiting  Side effects that usually do not require medical attention (report to your doctor or health care professional if they continue or are bothersome):   diarrhea   dizziness   drowsiness   dry mouth   headache   nausea, vomiting   pain, tingling, numbness in the hands or feet   stomach pain  What may interact with this medicine?  Do not take this medicine with any of the following medicines:   certain medicines for migraine headache like almotriptan, eletriptan, frovatriptan, naratriptan, rizatriptan, sumatriptan, zolmitriptan   ergot alkaloids like dihydroergotamine, ergonovine, ergotamine, methylergonovine   MAOIs like Carbex, Eldepryl, Marplan, Nardil, and Parnate  This medicine may also interact with the following medications:   certain medicines for depression, anxiety, or psychotic disorders   propranolol  What if I miss a dose?  This does not apply. This medicine is not for regular use.  Where should I keep my medicine?  Keep out of the reach of children.  Store at room temperature between 15 and 30 degrees C (59 and 86 degrees F). Keep container tightly closed. Throw away any unused medicine after the expiration date.  What should I tell my health care provider before I take this medicine?   They need to know if you have any of these conditions:   cigarette smoker   circulation problems in fingers and toes   diabetes   heart disease   high blood pressure   high cholesterol   history of irregular heartbeat   history of stroke   kidney disease   liver disease   stomach or intestine problems   an unusual or allergic reaction to rizatriptan, other medicines, foods, dyes, or preservatives   pregnant or trying to  get pregnant   breast-feeding  What should I watch for while using this medicine?  Visit your healthcare professional for regular checks on your progress. Tell your healthcare professional if your symptoms do not start to get better or if they get worse.  You may get drowsy or dizzy. Do not drive, use machinery, or do anything that needs mental alertness until you know how this medicine affects you. Do not stand up or sit up quickly, especially if you are an older patient. This reduces the risk of dizzy or fainting spells. Alcohol may interfere with the effect of this medicine.  Your mouth may get dry. Chewing sugarless gum or sucking hard candy and drinking plenty of water may help. Contact your healthcare professional if the problem does not go away or is severe.  If you take migraine medicines for 10 or more days a month, your migraines may get worse. Keep a diary of headache days and medicine use. Contact your healthcare professional if your migraine attacks occur more frequently.  NOTE:This sheet is a summary. It may not cover all possible information. If you have questions about this medicine, talk to your doctor, pharmacist, or health care provider. Copyright 2020 [employer]

## 2019-03-05 ENCOUNTER — Ambulatory Visit (INDEPENDENT_AMBULATORY_CARE_PROVIDER_SITE_OTHER): Payer: Self-pay

## 2019-03-14 ENCOUNTER — Ambulatory Visit (INDEPENDENT_AMBULATORY_CARE_PROVIDER_SITE_OTHER): Payer: Self-pay

## 2019-03-29 ENCOUNTER — Ambulatory Visit (INDEPENDENT_AMBULATORY_CARE_PROVIDER_SITE_OTHER): Payer: Self-pay

## 2019-05-23 ENCOUNTER — Encounter (INDEPENDENT_AMBULATORY_CARE_PROVIDER_SITE_OTHER): Payer: Self-pay | Admitting: Internal Medicine

## 2019-05-23 ENCOUNTER — Telehealth (INDEPENDENT_AMBULATORY_CARE_PROVIDER_SITE_OTHER): Payer: HMO | Admitting: Internal Medicine

## 2019-05-23 DIAGNOSIS — G43911 Migraine, unspecified, intractable, with status migrainosus: Secondary | ICD-10-CM

## 2019-05-23 DIAGNOSIS — G44039 Episodic paroxysmal hemicrania, not intractable: Secondary | ICD-10-CM

## 2019-05-23 DIAGNOSIS — G479 Sleep disorder, unspecified: Secondary | ICD-10-CM

## 2019-05-23 DIAGNOSIS — R0789 Other chest pain: Secondary | ICD-10-CM

## 2019-05-23 DIAGNOSIS — G43909 Migraine, unspecified, not intractable, without status migrainosus: Secondary | ICD-10-CM | POA: Insufficient documentation

## 2019-05-23 DIAGNOSIS — R002 Palpitations: Secondary | ICD-10-CM

## 2019-05-23 MED ORDER — FROVATRIPTAN SUCCINATE 2.5 MG PO TABS
2.50 mg | ORAL_TABLET | ORAL | 0 refills | Status: DC | PRN
Start: 2019-05-23 — End: 2019-06-21

## 2019-05-23 NOTE — Progress Notes (Signed)
TELEMEDICINE VISIT              CLINICAL SUMMARY        Verbal Consent      Verbal consent has been obtained from the patient to conduct a telephone visit encounter to minimize exposure to COVID-19: yes      Chief Complaint:      Chief Complaint   Patient presents with    heart issues     discuss some heatlh concerns          Problem List, Medications, and Allergies reviewed: yes      HPI:          Palpitations   This is a chronic problem. The current episode started in the past 7 days. The problem occurs daily. The problem has been unchanged. Nothing aggravates the symptoms. Pertinent negatives include no anxiety, chest fullness, chest pain, coughing, diaphoresis, dizziness, fever, irregular heartbeat, malaise/fatigue, nausea, near-syncope, numbness, shortness of breath, syncope, vomiting or weakness. He has tried nothing for the symptoms. The treatment provided no relief. Risk factors include being male. There is no history of anemia, anxiety, drug use, heart disease, hyperthyroidism or a valve disorder.   Migraine   This is a chronic problem. The current episode started 1 to 4 weeks ago. The problem occurs daily. The problem has been unchanged. The pain is located in the bilateral region. The pain does not radiate. The pain quality is similar to prior headaches. The pain is at a severity of 5/10. The pain is moderate. Pertinent negatives include no abdominal pain, abnormal behavior, anorexia, back pain, blurred vision, coughing, dizziness, drainage, ear pain, eye pain, eye redness, eye watering, facial sweating, fever, hearing loss, insomnia, loss of balance, muscle aches, nausea, neck pain, numbness, phonophobia, photophobia, rhinorrhea, scalp tenderness, seizures, sinus pressure, sore throat, swollen glands, tingling, tinnitus, visual change, vomiting, weakness or weight loss. The symptoms are aggravated by activity. He has tried triptans (maxalt) for the symptoms. The treatment provided no relief.  His past medical history is significant for migraine headaches. There is no history of cancer, cluster headaches, hypertension, immunosuppression, migraines in the family, obesity, pseudotumor cerebri, recent head traumas, sinus disease or TMJ.           Assessment/Plan:    1. Heart palpitations  - Referral to Cardiology - EXTERNAL    2. Chest pressure  - Referral to Cardiology - EXTERNAL    3. Sleep disorder  - Ambulatory referral to Sleep Studies    4. Episodic paroxysmal hemicrania, not intractable  - frovatriptan (FROVA) 2.5 MG tablet; Take 1 tablet (2.5 mg total) by mouth as needed for Migraine If recurs, may repeat after 2 hours. Max of 3 tabs in 24 hours.  Dispense: 30 tablet; Refill: 0    5. Intractable migraine with status migrainosus, unspecified migraine type                  Time spent in medical discussion: 11 to 20 minutes    Herbert Deaner, MD

## 2019-05-23 NOTE — Patient Instructions (Signed)
Frovatriptan tablets  Brand Name: Frova  What is this medicine?  FROVATRIPTAN ([place] TRIP tan) is used to treat migraines with or without aura. An aura is a strange feeling or visual disturbance that warns you of an attack. It is not used to prevent migraines.  How should I use this medicine?  Take this medicine by mouth with a glass of water. Follow the directions on the prescription label. Do not take it more often than directed.  Talk to your pediatrician regarding the use of this medicine in children. Special care may be needed.  What side effects may I notice from receiving this medicine?  Side effects that you should report to your doctor or health care professional as soon as possible:   allergic reactions like skin rash, itching or hives, swelling of the face, lips, or tongue   changes in vision   chest pain or chest tightness   signs and symptoms of a dangerous change in heartbeat or heart rhythm like chest pain; dizziness; fast, irregular heartbeat; palpitations; feeling faint or lightheaded; falls; breathing problems   signs and symptoms of a stroke like changes in vision; confusion; trouble speaking or understanding; severe headaches; sudden numbness or weakness of the face, arm or leg; trouble walking; dizziness; loss of balance or coordination   signs and symptoms of serotonin syndrome like irritable; confusion; diarrhea; fast or irregular heartbeat; muscle twitching; stiff muscles; trouble walking; sweating; high fever; seizures; chills; vomiting  Side effects that usually do not require medical attention (report to your doctor or health care professional if they continue or are bothersome):   diarrhea   dizziness   dry mouth   headache   nausea, vomiting   pain, tingling, numbness in the hands or feet   stomach pain  What may interact with this medicine?  Do not take this medicine with any of the following medicines:   certain medicines for migraine headache like almotriptan,  eletriptan, frovatriptan, naratriptan, rizatriptan, sumatriptan, zolmitriptan   ergot alkaloids like dihydroergotamine, ergonovine, ergotamine, methylergonovine  This medicine may also interact with the following medications:   certain medicines for depression, anxiety, or psychotic disorders   MAOIs like Carbex, Eldepryl, Marplan, Nardil, and Parnate  What if I miss a dose?  This does not apply. This medicine is not for regular use.  Where should I keep my medicine?  Keep out of the reach of children.  Store at room temperature between 20 and 25 degrees C (68 and 77 degrees F). Protect from light and moisture. Throw away any unused medicine after the expiration date.  What should I tell my health care provider before I take this medicine?  They need to know if you have any of these conditions:   cigarette smoker   circulation problems in fingers and toes   diabetes   heart disease   high blood pressure   high cholesterol   history of irregular heartbeat   history of stroke   kidney disease   liver disease   stomach or intestine problems   an unusual or allergic reaction to frovatriptan, other medicines, foods, dyes, or preservatives   pregnant or trying to get pregnant   breast-feeding  What should I watch for while using this medicine?  Visit your healthcare professional for regular checks on your progress. Tell your healthcare professional if your symptoms do not start to get better or if they get worse.  You may get drowsy or dizzy. Do not drive, use machinery,  or do anything that needs mental alertness until you know how this medicine affects you. Do not stand up or sit up quickly, especially if you are an older patient. This reduces the risk of dizzy or fainting spells. Alcohol may interfere with the effect of this medicine.  Your mouth may get dry. Chewing sugarless gum or sucking hard candy and drinking plenty of water may help. Contact your healthcare professional if the problem does not go  away or is severe.  Tell your healthcare professional right away if you have any change in your eyesight.  If you take migraine medicines for 10 or more days a month, your migraines may get worse. Keep a diary of headache days and medicine use. Contact your healthcare professional if your migraine attacks occur more frequently.  NOTE:This sheet is a summary. It may not cover all possible information. If you have questions about this medicine, talk to your doctor, pharmacist, or health care provider. Copyright 2020 [employer]

## 2019-05-23 NOTE — Progress Notes (Signed)
Have you seen any specialists/other providers since your last visit with us?    Yes PT    Arm preference verified?   No    The patient is due for influenza vaccine

## 2019-06-03 ENCOUNTER — Telehealth (INDEPENDENT_AMBULATORY_CARE_PROVIDER_SITE_OTHER): Payer: Self-pay

## 2019-06-03 NOTE — Telephone Encounter (Signed)
Please Icea, can you check with pt's insurance what alternatives to Frovatriptan they will cover according to Dr Alen Blew pt has tried other triptans including MAXALT and they have not worked. Thanks!

## 2019-06-03 NOTE — Telephone Encounter (Signed)
Pharmacy is requesting alternative to Frovatriptan succ 2.5 MG (not covered by insurance)

## 2019-06-04 ENCOUNTER — Ambulatory Visit (INDEPENDENT_AMBULATORY_CARE_PROVIDER_SITE_OTHER): Payer: Self-pay | Admitting: Residents

## 2019-06-04 MED ORDER — SUMATRIPTAN SUCCINATE 100 MG PO TABS
100.00 mg | ORAL_TABLET | ORAL | 0 refills | Status: DC | PRN
Start: 2019-06-04 — End: 2019-06-21

## 2019-06-04 NOTE — Telephone Encounter (Signed)
Please inform pt, that I have sent imitrex( sumatriptan) prescription to his pharmacy as that is what his insurance covers.I recommend he schedules follow up with Dr Alen Blew if further med concerns. Thanks!

## 2019-06-04 NOTE — Telephone Encounter (Signed)
Dr. Suanne Marker,   I just spoke with the pts insurance company. There is only one other medication that is covered with no copay under the pts current plan:     Sumatriptan (25mg , 50mg , & 100mg ) all covered with no copay.

## 2019-06-04 NOTE — Telephone Encounter (Signed)
Thomas Carr can you please assist.

## 2019-06-05 NOTE — Telephone Encounter (Signed)
Pt was calling back because he missed a call from a nurse. I informed him that his medication was sent over to the pharmacy!    Thanks!

## 2019-06-05 NOTE — Telephone Encounter (Signed)
Attempted to contact pt via phone. No answer; LVM. This nurse is attempting to contact pt to inform him a prescription of Imitrex has been sent to pharmacy.

## 2019-06-06 ENCOUNTER — Ambulatory Visit (INDEPENDENT_AMBULATORY_CARE_PROVIDER_SITE_OTHER): Payer: Self-pay

## 2019-06-06 ENCOUNTER — Encounter (INDEPENDENT_AMBULATORY_CARE_PROVIDER_SITE_OTHER): Payer: Self-pay | Admitting: Internal Medicine

## 2019-06-21 ENCOUNTER — Ambulatory Visit (FREE_STANDING_LABORATORY_FACILITY): Payer: HMO | Admitting: Internal Medicine

## 2019-06-21 ENCOUNTER — Encounter (INDEPENDENT_AMBULATORY_CARE_PROVIDER_SITE_OTHER): Payer: Self-pay | Admitting: Internal Medicine

## 2019-06-21 VITALS — BP 129/86 | HR 83 | Temp 98.0°F | Resp 18 | Ht 68.0 in | Wt 171.6 lb

## 2019-06-21 DIAGNOSIS — Z0000 Encounter for general adult medical examination without abnormal findings: Secondary | ICD-10-CM

## 2019-06-21 DIAGNOSIS — R002 Palpitations: Secondary | ICD-10-CM

## 2019-06-21 DIAGNOSIS — Z13220 Encounter for screening for lipoid disorders: Secondary | ICD-10-CM

## 2019-06-21 DIAGNOSIS — Z131 Encounter for screening for diabetes mellitus: Secondary | ICD-10-CM

## 2019-06-21 DIAGNOSIS — R05 Cough: Secondary | ICD-10-CM

## 2019-06-21 DIAGNOSIS — Z23 Encounter for immunization: Secondary | ICD-10-CM

## 2019-06-21 DIAGNOSIS — Z1329 Encounter for screening for other suspected endocrine disorder: Secondary | ICD-10-CM

## 2019-06-21 DIAGNOSIS — J301 Allergic rhinitis due to pollen: Secondary | ICD-10-CM

## 2019-06-21 DIAGNOSIS — L989 Disorder of the skin and subcutaneous tissue, unspecified: Secondary | ICD-10-CM

## 2019-06-21 DIAGNOSIS — G43911 Migraine, unspecified, intractable, with status migrainosus: Secondary | ICD-10-CM

## 2019-06-21 DIAGNOSIS — R053 Chronic cough: Secondary | ICD-10-CM

## 2019-06-21 LAB — CBC AND DIFFERENTIAL
Absolute NRBC: 0 10*3/uL (ref 0.00–0.00)
Basophils Absolute Automated: 0.03 10*3/uL (ref 0.00–0.08)
Basophils Automated: 0.6 %
Eosinophils Absolute Automated: 0.1 10*3/uL (ref 0.00–0.44)
Eosinophils Automated: 2 %
Hematocrit: 46 % (ref 37.6–49.6)
Hgb: 15.1 g/dL (ref 12.5–17.1)
Immature Granulocytes Absolute: 0.01 10*3/uL (ref 0.00–0.07)
Immature Granulocytes: 0.2 %
Lymphocytes Absolute Automated: 1.94 10*3/uL (ref 0.42–3.22)
Lymphocytes Automated: 39.8 %
MCH: 27.7 pg (ref 25.1–33.5)
MCHC: 32.8 g/dL (ref 31.5–35.8)
MCV: 84.4 fL (ref 78.0–96.0)
MPV: 10.8 fL (ref 8.9–12.5)
Monocytes Absolute Automated: 0.37 10*3/uL (ref 0.21–0.85)
Monocytes: 7.6 %
Neutrophils Absolute: 2.43 10*3/uL (ref 1.10–6.33)
Neutrophils: 49.8 %
Nucleated RBC: 0 /100 WBC (ref 0.0–0.0)
Platelets: 283 10*3/uL (ref 142–346)
RBC: 5.45 10*6/uL (ref 4.20–5.90)
RDW: 14 % (ref 11–15)
WBC: 4.88 10*3/uL (ref 3.10–9.50)

## 2019-06-21 LAB — HEMOLYSIS INDEX: Hemolysis Index: 3 (ref 0–18)

## 2019-06-21 LAB — COMPREHENSIVE METABOLIC PANEL
ALT: 65 U/L — ABNORMAL HIGH (ref 0–55)
AST (SGOT): 30 U/L (ref 5–34)
Albumin/Globulin Ratio: 1.6 (ref 0.9–2.2)
Albumin: 4.2 g/dL (ref 3.5–5.0)
Alkaline Phosphatase: 42 U/L (ref 38–106)
Anion Gap: 9 (ref 5.0–15.0)
BUN: 9 mg/dL (ref 9.0–28.0)
Bilirubin, Total: 0.4 mg/dL (ref 0.2–1.2)
CO2: 26 mEq/L (ref 21–29)
Calcium: 9 mg/dL (ref 8.5–10.5)
Chloride: 105 mEq/L (ref 100–111)
Creatinine: 1 mg/dL (ref 0.5–1.5)
Globulin: 2.7 g/dL (ref 2.0–3.7)
Glucose: 103 mg/dL — ABNORMAL HIGH (ref 70–100)
Potassium: 4.2 mEq/L (ref 3.5–5.1)
Protein, Total: 6.9 g/dL (ref 6.0–8.3)
Sodium: 140 mEq/L (ref 136–145)

## 2019-06-21 LAB — LIPID PANEL
Cholesterol / HDL Ratio: 3.4
Cholesterol: 162 mg/dL (ref 0–199)
HDL: 48 mg/dL (ref 40–9999)
LDL Calculated: 106 mg/dL — ABNORMAL HIGH (ref 0–99)
Triglycerides: 38 mg/dL (ref 34–149)
VLDL Calculated: 8 mg/dL — ABNORMAL LOW (ref 10–40)

## 2019-06-21 LAB — GFR: EGFR: >60

## 2019-06-21 LAB — TSH: TSH: 1.47 u[IU]/mL (ref 0.35–4.94)

## 2019-06-21 MED ORDER — MONTELUKAST SODIUM 10 MG PO TABS
10.0000 mg | ORAL_TABLET | Freq: Every day | ORAL | 3 refills | Status: DC
Start: 2019-06-21 — End: 2021-08-16

## 2019-06-21 MED ORDER — SUMATRIPTAN SUCCINATE 100 MG PO TABS
100.0000 mg | ORAL_TABLET | ORAL | 0 refills | Status: DC | PRN
Start: 2019-06-21 — End: 2020-07-16

## 2019-06-21 MED ORDER — AZELASTINE HCL 0.1 % NA SOLN
2.00 | Freq: Two times a day (BID) | NASAL | 4 refills | Status: AC
Start: 2019-06-21 — End: 2020-06-20

## 2019-06-21 MED ORDER — CLOTRIMAZOLE-BETAMETHASONE 1-0.05 % EX CREA
TOPICAL_CREAM | Freq: Two times a day (BID) | CUTANEOUS | 1 refills | Status: DC
Start: 2019-06-21 — End: 2020-12-11

## 2019-06-21 NOTE — Progress Notes (Signed)
Have you seen any specialists/other providers since your last visit with us?    Yes    Arm preference verified?   Yes    The patient is due for influenza vaccine

## 2019-06-21 NOTE — Progress Notes (Signed)
Subjective:      Date: 06/21/2019 8:26 AM   Patient ID: Thomas Carr is a 34 y.o. male.    Chief Complaint:  Chief Complaint   Patient presents with    Annual Exam     Pt fasting       HPI  Visit Type: Health Maintenance Visit  Work Status: working full-time  Reported Health: good health  Reported Diet: compliant with recommended diet, low fat/cholesterol diet, low calorie diet and well-balanced diet, less fried foods and changed to diet drinks  Reported Exercise: daily, 1-2 hours/day, jogging/running and exercise bike  Dental: regular dental visits twice a year  Vision: no vision correction needed and eye exam > 1 year ago  Hearing: normal hearing  Immunization Status: immunizations up to date and Influenza vaccination due  Reproductive Health: sexually active, using safe sex practices: yes and History of STD: negative  Prior Screening Tests: no prior PSA testing, prostate cancer screening not appropriate at this time, no previous colorectal cancer screening, colon cancer screening not approriate at this time, no previous dexa scan and dexa scan not appropriate at this time  General Health Risks: no family history of prostate cancer, no family history of colon cancer and no family history of breast cancer  Safety Elements Used: uses seat belts, smoke detectors in household, carbon monoxide detectors in household, sunscreen use, does not text and drive and no guns at home    Problem List:  Patient Active Problem List   Diagnosis    Encounter for screening for lipid disorder    Need for vaccination    Migraine       Current Medications:  Current Outpatient Medications   Medication Sig Dispense Refill    Acetaminophen (TYLENOL PO) Take by mouth      azelastine (ASTELIN) 0.1 % nasal spray 2 sprays by Nasal route 2 (two) times daily 2 sprays each nostril twice a day as needed 30 mL 4    Fexofenadine HCl (ALLEGRA PO) Take by mouth      Pseudoephedrine-DM-GG (SUDAFED COUGH PO) Take by mouth      SUMAtriptan  (Imitrex) 100 MG tablet Take 1 tablet (100 mg total) by mouth every 2 (two) hours as needed for Migraine Take at onset of migraine;  May repeat in 2 hrs - max dosing 200mg /24 hrs 18 tablet 0    montelukast (SINGULAIR) 10 MG tablet Take 1 tablet (10 mg total) by mouth daily 90 tablet 3     No current facility-administered medications for this visit.        Allergies:  No Known Allergies    Past Medical History:  Past Medical History:   Diagnosis Date    Seasonal allergic rhinitis        Past Surgical History:  History reviewed. No pertinent surgical history.    Family History:  Family History   Problem Relation Age of Onset    Hypertension Mother     Diabetes Father     Other Father         Cardiac Arrest       Social History:  Social History     Socioeconomic History    Marital status: Single     Spouse name: Not on file    Number of children: Not on file    Years of education: Not on file    Highest education level: Not on file   Occupational History    Not on file   Social Needs  Financial resource strain: Not on file    Food insecurity     Worry: Not on file     Inability: Not on file    Transportation needs     Medical: Not on file     Non-medical: Not on file   Tobacco Use    Smoking status: Never Smoker    Smokeless tobacco: Never Used    Tobacco comment: just tried " a long time ago"   Substance and Sexual Activity    Alcohol use: Yes     Comment: socailly    Drug use: No    Sexual activity: Yes     Partners: Female     Birth control/protection: Condom   Lifestyle    Physical activity     Days per week: Not on file     Minutes per session: Not on file    Stress: Not on file   Relationships    Social connections     Talks on phone: Not on file     Gets together: Not on file     Attends religious service: Not on file     Active member of club or organization: Not on file     Attends meetings of clubs or organizations: Not on file     Relationship status: Not on file    Intimate partner  violence     Fear of current or ex partner: Not on file     Emotionally abused: Not on file     Physically abused: Not on file     Forced sexual activity: Not on file   Other Topics Concern    Not on file   Social History Narrative    Not on file       The following sections were reviewed this encounter by the provider:   Tobacco   Allergies   Meds   Problems   Med Hx   Surg Hx   Fam Hx   Soc Hx          Review of Systems   Constitutional: Negative.    HENT: Negative.    Respiratory: Negative.    Cardiovascular: Negative.    Gastrointestinal: Negative.    Genitourinary: Negative.    Musculoskeletal: Negative.    Skin: Negative.    Neurological: Negative.    Psychiatric/Behavioral: Negative.        Objective:     Vitals:  BP 129/86 (BP Site: Left arm, Cuff Size: Medium)    Pulse 83    Temp 98 F (36.7 C) (Oral)    Resp 18    Ht 1.727 m (5\' 8" )    Wt 77.8 kg (171 lb 9.6 oz)    BMI 26.09 kg/m   BP Readings from Last 3 Encounters:   06/21/19 129/86   05/22/18 117/74   03/27/18 131/73       Examination:   General Examination:   GENERAL APPEARANCE: alert, in no acute distress, well developed, well nourished, oriented to time, place, and person.   HEAD: normal appearance, atraumatic.   EYES: extraocular movement intact (EOMI), pupils equal, round, reactive to light and accommodation, sclera anicteric, conjunctiva clear.   EARS: tympanic membranes normal bilaterally, external canals normal .   NOSE: normal nasal mucosa, no lesions.   ORAL CAVITY: normal oropharynx, normal lips, mucosa moist, no lesions.   THROAT: normal appearance, clear, no erythema.   NECK/THYROID: neck supple, no carotid bruit, carotid pulse 2+ bilaterally, no cervical lymphadenopathy, no neck  mass palpated, no jugular venous distention, no thyromegaly.   LYMPH NODES: no palpable adenopathy.   SKIN: good turgor, no rashes, no suspicious lesions.   HEART: S1, S2 normal, no murmurs, rubs, gallops, regular rate and rhythm.   LUNGS: normal effort / no  distress, normal breath sounds, clear to auscultation bilaterally, no wheezes, rales, rhonchi.   ABDOMEN: bowel sounds present, no hepatosplenomegaly, soft, nontender, nondistended.   RECTAL: normal tone, no masses palpable, prostate normal.   MALE GENITOURINARY: no penile lesions or discharge, no testicular mass.   MUSCULOSKELETAL: full range of motion, no swelling or deformity.   EXTREMITIES: no edema, no clubbing, cyanosis, or edema.   PERIPHERAL PULSES: 2+ dorsalis pedis, 2+ posterior tibial.   NEUROLOGIC: nonfocal, cranial nerves 2-12 grossly intact, deep tendon reflexes 2+ symmetrical, normal strength, tone and reflexes, sensory exam intact.   PSYCH: cognitive function intact, mood/affect full range, speech clear.     Lab Results   Component Value Date    WBC 4.64 05/22/2018    HGB 16.1 05/22/2018    HCT 45.8 05/22/2018    PLT 292 05/22/2018    CHOL 179 05/22/2018    TRIG 64 05/22/2018    HDL 52 05/22/2018    LDL 114 (H) 05/22/2018    ALT 44 05/22/2018    AST 21 05/22/2018    NA 139 05/22/2018    K 3.9 05/22/2018    CL 104 05/22/2018    CREAT 0.9 05/22/2018    BUN 14.0 05/22/2018    CO2 26 05/22/2018    TSH 1.36 05/22/2018    GLU 79 05/22/2018    HGBA1C 4.8 05/22/2018     Assessment/Plan:       1. Physical exam, annual    2. Intractable migraine with status migrainosus, unspecified migraine type  - CBC and differential  - Comprehensive metabolic panel  - SUMAtriptan (Imitrex) 100 MG tablet; Take 1 tablet (100 mg total) by mouth every 2 (two) hours as needed for Migraine Take at onset of migraine;  May repeat in 2 hrs - max dosing 200mg /24 hrs  Dispense: 18 tablet; Refill: 0    3. Screening for diabetes mellitus  - CBC and differential  - Comprehensive metabolic panel  - Lipid panel  - Hemoglobin A1C    4. Encounter for screening for lipid disorder  - Comprehensive metabolic panel  - Lipid panel    5. Screening for thyroid disorder  - TSH    6. Heart palpitations  - CBC and differential  - Comprehensive  metabolic panel  - TSH    7. Chronic cough  - azelastine (ASTELIN) 0.1 % nasal spray; 2 sprays by Nasal route 2 (two) times daily 2 sprays each nostril twice a day as needed  Dispense: 30 mL; Refill: 4    8. Non-seasonal allergic rhinitis due to pollen  - montelukast (SINGULAIR) 10 MG tablet; Take 1 tablet (10 mg total) by mouth daily  Dispense: 90 tablet; Refill: 3    9. Need for vaccination  - Flu Vaccine Quadrivalent 6 months & up PRESERVATIVE FREE (Afluria / Fluarix / Flulaval / Fluzone)    Health Maintenance:  High BMI follow-up: Encouragement to Exercise. Recommend optimizing low carbohydrate diet efforts and obtaining at least 150 minutes of aerobic exercise per week. Recommend 20-25 grams of dietary fiber daily. Recommend drinking at least 60-80 ounces of water per day. Recommend optimizing low sodium diet measures ( less than 2 grams of sodium in the diet per day ).  Obtain a nutrition consult to help individualize dietary recommendations to help achieve glycemic and/or lipid goals Obtain a naturopathic consult to help individualize dietary and supplement  recommendations to help achieve glycemic and/or lipid goalsImmunizations UTD. Vision screening UTD. Dental Screening UTD.    Check fasting blood work as above  Estate manager/land agent) as above  Continue to work on improved lifestyle modifications (eating healthy/exercise) to help with medical management of problems  Risks & Benefits of any new or previous medication(s) were explained to the patient who verbalized understanding & agreed to the treatment plan  Follow up with all specialists as recommended/scheduled  Herbert Deaner, MD  Media Information                  Document Information    Progress Note   [facility] - Progress note - 06/04/19   06/06/2019 09:13   Attached To:   Scan Only on 06/06/19 with Alen Blew Doreene Adas, MD   Source Information    Margie Billet   [facility]

## 2019-06-21 NOTE — Patient Instructions (Addendum)
Influenza Virus Vaccine injection (Fluarix)  What is this medicine?  INFLUENZA VIRUS VACCINE (in floo EN zuh VAHY ruhs vak SEEN) helps to reduce the risk of getting influenza also known as the flu.  How should I use this medicine?  This vaccine is for injection into a muscle. It is given by a health care professional.  A copy of Vaccine Information Statements will be given before each vaccination. Read this sheet carefully each time. The sheet may change frequently.  Talk to your pediatrician regarding the use of this medicine in children. Special care may be needed.  What side effects may I notice from receiving this medicine?  Side effects that you should report to your doctor or health care professional as soon as possible:   allergic reactions like skin rash, itching or hives, swelling of the face, lips, or tongue  Side effects that usually do not require medical attention (report to your doctor or health care professional if they continue or are bothersome):   fever   headache   muscle aches and pains   pain, tenderness, redness, or swelling at site where injected   weak or tired  What may interact with this medicine?   chemotherapy or radiation therapy   medicines that lower your immune system like etanercept, anakinra, infliximab, and adalimumab   medicines that treat or prevent blood clots like warfarin   phenytoin   steroid medicines like prednisone or cortisone   theophylline   vaccines    What if I miss a dose?  This does not apply.  Where should I keep my medicine?  This vaccine is only given in a clinic, pharmacy, doctor's office, or other health care setting and will not be stored at home.  What should I tell my health care provider before I take this medicine?  They need to know if you have any of these conditions:   bleeding disorder like hemophilia   fever or infection   Guillain-Barre syndrome or other neurological problems   immune system problems   infection with the human  immunodeficiency virus (HIV) or AIDS   low blood platelet counts   multiple sclerosis   an unusual or allergic reaction to influenza virus vaccine, eggs, chicken proteins, latex, gentamicin, other medicines, foods, dyes or preservatives   pregnant or trying to get pregnant   breast-feeding  What should I watch for while using this medicine?  Report any side effects that do not go away within 3 days to your doctor or health care professional. Call your health care provider if any unusual symptoms occur within 6 weeks of receiving this vaccine.  You may still catch the flu, but the illness is not usually as bad. You cannot get the flu from the vaccine. The vaccine will not protect against colds or other illnesses that may cause fever. The vaccine is needed every year.  NOTE:This sheet is a summary. It may not cover all possible information. If you have questions about this medicine, talk to your doctor, pharmacist, or health care provider. Copyright 2020 [employer]        Sumatriptan tablets  Brand Names: Imitrex, Migraine Pack  What is this medicine?  SUMATRIPTAN (soo ma TRIP tan) is used to treat migraines with or without aura. An aura is a strange feeling or visual disturbance that warns you of an attack. It is not used to prevent migraines.  How should I use this medicine?  Take this medicine by mouth with a glass of  water. Follow the directions on the prescription label. Do not take it more often than directed.  Talk to your pediatrician regarding the use of this medicine in children. Special care may be needed.  What side effects may I notice from receiving this medicine?  Side effects that you should report to your doctor or health care professional as soon as possible:   allergic reactions like skin rash, itching or hives, swelling of the face, lips, or tongue   changes in vision   chest pain or chest tightness   signs and symptoms of a dangerous change in heartbeat or heart rhythm like chest pain;  dizziness; fast, irregular heartbeat; palpitations; feeling faint or lightheaded; falls; breathing problems   signs and symptoms of a stroke like changes in vision; confusion; trouble speaking or understanding; severe headaches; sudden numbness or weakness of the face, arm or leg; trouble walking; dizziness; loss of balance or coordination   signs and symptoms of serotonin syndrome like irritable; confusion; diarrhea; fast or irregular heartbeat; muscle twitching; stiff muscles; trouble walking; sweating; high fever; seizures; chills; vomiting  Side effects that usually do not require medical attention (report to your doctor or health care professional if they continue or are bothersome):   diarrhea   dizziness   drowsiness   dry mouth   headache   nausea, vomiting   pain, tingling, numbness in the hands or feet   stomach pain  What may interact with this medicine?  Do not take this medicine with any of the following medicines:   certain medicines for migraine headache like almotriptan, eletriptan, frovatriptan, naratriptan, rizatriptan, sumatriptan, zolmitriptan   ergot alkaloids like dihydroergotamine, ergonovine, ergotamine, methylergonovine   MAOIs like Carbex, Eldepryl, Marplan, Nardil, and Parnate  This medicine may also interact with the following medications:   certain medicines for depression, anxiety, or psychotic disorders  What if I miss a dose?  This does not apply. This medicine is not for regular use.  Where should I keep my medicine?  Keep out of the reach of children.  Store at room temperature between 2 and 30 degrees C (36 and 86 degrees F). Throw away any unused medicine after the expiration date.  What should I tell my health care provider before I take this medicine?  They need to know if you have any of these conditions:   cigarette smoker   circulation problems in fingers and toes   diabetes   heart disease   high blood pressure   high cholesterol   history of irregular  heartbeat   history of stroke   kidney disease   liver disease   stomach or intestine problems   an unusual or allergic reaction to sumatriptan, other medicines, foods, dyes, or preservatives   pregnant or trying to get pregnant   breast-feeding  What should I watch for while using this medicine?  Visit your healthcare professional for regular checks on your progress. Tell your healthcare professional if your symptoms do not start to get better or if they get worse.  You may get drowsy or dizzy. Do not drive, use machinery, or do anything that needs mental alertness until you know how this medicine affects you. Do not stand up or sit up quickly, especially if you are an older patient. This reduces the risk of dizzy or fainting spells. Alcohol may interfere with the effect of this medicine.  Tell your healthcare professional right away if you have any change in your eyesight.  If you  take migraine medicines for 10 or more days a month, your migraines may get worse. Keep a diary of headache days and medicine use. Contact your healthcare professional if your migraine attacks occur more frequently.  NOTE:This sheet is a summary. It may not cover all possible information. If you have questions about this medicine, talk to your doctor, pharmacist, or health care provider. Copyright 2020 Else[employer]        Montelukast oral tablets  Brand Name: Singulair  What is this medicine?  MONTELUKAST (mon te LOO kast) is used to prevent and treat the symptoms of asthma. It is also used to treat allergies. Do not use for an acute asthma attack.  How should I use this medicine?  This medicine should be given by mouth. Follow the directions on the prescription label. Take this medicine at the same time every day. You may take this medicine with or without meals. Do not chew the tablets. Do not stop taking your medicine unless your doctor tells you to.  Talk to your pediatrician regarding the use of this medicine in children.  Special care may be needed. While this drug may be prescribed for children as young as 44 years of age for selected conditions, precautions do apply.  What side effects may I notice from receiving this medicine?  Side effects that you should report to your doctor or health care professional as soon as possible:   allergic reactions like skin rash or hives, or swelling of the face, lips, or tongue   breathing problems   changes in emotions or moods   confusion   depressed mood   fever or infection   hallucinations   joint pain   painful lumps under the skin   pain, tingling, numbness in the hands or feet   redness, blistering, peeling, or loosening of the skin, including inside the mouth   restlessness   seizures   sleep walking   signs and symptoms of infection like fever; chills; cough; sore throat; flu-like illness   signs and symptoms of liver injury like dark yellow or brown urine; general ill feeling or flu-like symptoms; light-colored stools; loss of appetite; nausea; right upper belly pain; unusually weak or tired; yellowing of the eyes or skin   sinus pain or swelling   stuttering   suicidal thoughts or other mood changes   tremors   trouble sleeping   uncontrolled muscle movements   unusual bleeding or bruising   vivid or bad dreams  Side effects that usually do not require medical attention (report to your doctor or health care professional if they continue or are bothersome):   dizziness   drowsiness   headache   runny nose   stomach upset   tiredness  What may interact with this medicine?     anti-infectives like rifampin and rifabutin   medicines for seizures like phenytoin, phenobarbital, and carbamazepine  What if I miss a dose?  If you miss a dose, skip it. Take your next dose at the normal time. Do not take extra or 2 doses at the same time to make up for the missed dose.  Where should I keep my medicine?  Keep out of the reach of children.  Store at room temperature  between 15 and 30 degrees C (59 and 86 degrees F). Protect from light and moisture. Keep this medicine in the original bottle. Throw away any unused medicine after the expiration date.  What should I tell my health care provider  before I take this medicine?  They need to know if you have any of these conditions:   liver disease   an unusual or allergic reaction to montelukast, other medicines, foods, dyes, or preservatives   pregnant or trying to get pregnant   breast-feeding  What should I watch for while using this medicine?  Visit your doctor or health care professional for regular checks on your progress. Tell your doctor or health care professional if your allergy or asthma symptoms do not improve. Take your medicine even when you do not have symptoms. Do not stop taking any of your medicine(s) unless your doctor tells you to.  If you have asthma, talk to your doctor about what to do in an acute asthma attack. Always have your inhaled rescue medicine for asthma attacks with you.  Patients and their families should watch for new or worsening thoughts of suicide or depression. Also watch for sudden changes in feelings such as feeling anxious, agitated, panicky, irritable, hostile, aggressive, impulsive, severely restless, overly excited and hyperactive, or not being able to sleep. Any worsening of mood or thoughts of suicide or dying should be reported to your health care professional right away.  NOTE:This sheet is a summary. It may not cover all possible information. If you have questions about this medicine, talk to your doctor, pharmacist, or health care provider. Copyright 2020 Else[employer]        Allergic Rhinitis  Allergic rhinitis is an allergic reaction that affects the nose, and often the eyes. Its often known asnasal allergies. Nasal allergies are often due to things in the environment that are breathed in. Depending what you are sensitive to, nasal allergies may occur only during certain seasons,  or they may occur year round. Common indoor allergens include house dust mites, mold, cockroaches, and pet dander. Outdoor allergens include pollen from trees, grasses, and weeds.   Symptoms include a drippy, stuffy, and itchy nose. They also include sneezing and red and itchy eyes. You may feel tired more often. Severe allergies may also affect your breathing and trigger a condition called asthma.   Tests can be done to see what allergens are affecting you. You may be referred to an allergy specialist for testing and further evaluation.   Home care  Your healthcare provider may prescribe medicines to help relieve allergy symptoms. These may include oral medicines, nasal sprays, or eye drops.   Ask your provider for advice on how to stay away from substances that you are allergic to.Below are a few tips for each type of allergen.   Pet dander:   Do not have pets with fur and feathers.   If you have a pet, keep it out of your bedroom and off upholstered furniture.  Pollen:   When pollen counts are high, keep windows of your car and home closed. If possible, use an air conditioner instead.   Wear a filter mask when mowing or doing yard work.  House dust mites:   Wash bedding every week in warm water and detergent and dry on a hot setting.   Cover the mattress, box spring, and pillows with allergy covers.   If possible, sleep in a room with no carpet, curtains, or upholstered furniture.  Cockroaches:   Store food in sealed containers.   Remove garbage from the home promptly.   Fix water leaks.  Mold:   Keep humidity low by using a dehumidifier or air conditioner. Keep the dehumidifier and air conditioner clean and free  of mold.   Clean moldy areas with bleach and water. Don't mix bleach with other cleaners.  In general:   Vacuum once or twice a week. If possible, use a vacuum with a high-efficiency particulate air (HEPA) filter.   Don't smoke. Stay away from cigarette smoke. Cigarette smoke is an  irritant that can make symptoms worse.  Follow-up care  Follow up as advised by the healthcare provider or our staff. If you were referred to an allergy specialist, make this appointment promptly.   When to seek medical advice  Call your healthcare provider or get medical care right away if the following occur:    Coughing   Fever of 100.82F (38C) or higher, or as directed by your healthcare provider   Raised red bumps (hives)   Continuing symptoms, new symptoms, or worsening symptoms  Call 911  Call 911if you have:    Trouble breathing   Severe swelling of the face or severe itching of the eyes or mouth   Wheezing or shortness of breath   Chest tightness   Dizziness or lightheadedness   Feeling of doom   Stomach pain, bloating, vomiting, or diarrhea  [employer] last reviewed this educational content on 03/06/2018   2000-2020 [employer], [place]. [address] [place], [place] [address]. All rights reserved. This information is not intended as a substitute for professional medical care. Always follow your healthcare professional's instructions.

## 2019-06-22 LAB — HEMOGLOBIN A1C
Average Estimated Glucose: 82.5 mg/dL
Hemoglobin A1C: 4.5 % — ABNORMAL LOW (ref 4.6–5.9)

## 2019-06-27 ENCOUNTER — Ambulatory Visit (INDEPENDENT_AMBULATORY_CARE_PROVIDER_SITE_OTHER): Payer: Self-pay

## 2019-07-04 ENCOUNTER — Encounter (INDEPENDENT_AMBULATORY_CARE_PROVIDER_SITE_OTHER): Payer: Self-pay | Admitting: Internal Medicine

## 2019-07-05 ENCOUNTER — Encounter (INDEPENDENT_AMBULATORY_CARE_PROVIDER_SITE_OTHER): Payer: Self-pay | Admitting: Internal Medicine

## 2019-07-23 ENCOUNTER — Encounter (INDEPENDENT_AMBULATORY_CARE_PROVIDER_SITE_OTHER): Payer: Self-pay | Admitting: Internal Medicine

## 2019-07-26 ENCOUNTER — Encounter (INDEPENDENT_AMBULATORY_CARE_PROVIDER_SITE_OTHER): Payer: Self-pay | Admitting: Internal Medicine

## 2019-09-06 ENCOUNTER — Ambulatory Visit (INDEPENDENT_AMBULATORY_CARE_PROVIDER_SITE_OTHER): Payer: Self-pay | Admitting: Residents

## 2019-09-06 LAB — VAHRT HISTORIC LVEF: Ejection Fraction: 65 %

## 2019-09-09 ENCOUNTER — Encounter (INDEPENDENT_AMBULATORY_CARE_PROVIDER_SITE_OTHER): Payer: Self-pay | Admitting: Internal Medicine

## 2019-09-19 ENCOUNTER — Other Ambulatory Visit (INDEPENDENT_AMBULATORY_CARE_PROVIDER_SITE_OTHER): Payer: Self-pay | Admitting: Internal Medicine

## 2019-10-29 ENCOUNTER — Ambulatory Visit (INDEPENDENT_AMBULATORY_CARE_PROVIDER_SITE_OTHER): Payer: HMO | Admitting: Internal Medicine

## 2019-12-11 ENCOUNTER — Encounter (INDEPENDENT_AMBULATORY_CARE_PROVIDER_SITE_OTHER): Payer: Self-pay | Admitting: Internal Medicine

## 2019-12-11 ENCOUNTER — Ambulatory Visit (INDEPENDENT_AMBULATORY_CARE_PROVIDER_SITE_OTHER): Payer: BLUE CROSS/BLUE SHIELD | Admitting: Internal Medicine

## 2019-12-11 VITALS — BP 112/76 | HR 120 | Temp 97.3°F | Resp 18 | Wt 166.4 lb

## 2019-12-11 DIAGNOSIS — Z01818 Encounter for other preprocedural examination: Secondary | ICD-10-CM

## 2019-12-11 LAB — CBC AND DIFFERENTIAL
Absolute NRBC: 0 10*3/uL (ref 0.00–0.00)
Basophils Absolute Automated: 0.02 10*3/uL (ref 0.00–0.08)
Basophils Automated: 0.4 %
Eosinophils Absolute Automated: 0.06 10*3/uL (ref 0.00–0.44)
Eosinophils Automated: 1.2 %
Hematocrit: 45.2 % (ref 37.6–49.6)
Hgb: 15.3 g/dL (ref 12.5–17.1)
Immature Granulocytes Absolute: 0.02 10*3/uL (ref 0.00–0.07)
Immature Granulocytes: 0.4 %
Lymphocytes Absolute Automated: 1.63 10*3/uL (ref 0.42–3.22)
Lymphocytes Automated: 32.9 %
MCH: 29 pg (ref 25.1–33.5)
MCHC: 33.8 g/dL (ref 31.5–35.8)
MCV: 85.6 fL (ref 78.0–96.0)
MPV: 11.3 fL (ref 8.9–12.5)
Monocytes Absolute Automated: 0.42 10*3/uL (ref 0.21–0.85)
Monocytes: 8.5 %
Neutrophils Absolute: 2.81 10*3/uL (ref 1.10–6.33)
Neutrophils: 56.6 %
Nucleated RBC: 0 /100 WBC (ref 0.0–0.0)
Platelets: 298 10*3/uL (ref 142–346)
RBC: 5.28 10*6/uL (ref 4.20–5.90)
RDW: 15 % (ref 11–15)
WBC: 4.96 10*3/uL (ref 3.10–9.50)

## 2019-12-11 LAB — PT/INR
PT INR: 1.1 (ref 0.9–1.1)
PT: 12.3 s (ref 10.1–12.9)

## 2019-12-11 NOTE — Progress Notes (Signed)
Subjective:      Date: 12/11/2019 1:33 PM   Patient ID: Thomas Carr is a 34 y.o. male.    Chief Complaint:  Chief Complaint   Patient presents with    Pre-op Exam     pt is having lipo on 12/20/19 with Dr. Danella Penton     Took pfizer vaccine x 2   HPI:  Visit Type: Pre-operative Evaluation  Procedure: plastic surgery liposuction  Date of Surgery: December 20, 2019  Surgeon: Dr. Danella Penton   Fax Number (Required): [phone]  Chief Complaint: Benign Prostatic Hypertrophy  Recent Health (admits): no current complaints  Recent Health (denies): no current complaints  Exercise Tolerance: 5 met ( i.e. dancing )  Surgical Risk Factors: No active cardiac or  pulmonary conditions  Prior Anesthesia: Patient reports no adverse reaction to anesthesia in the past.     Problem List:  Patient Active Problem List   Diagnosis    Encounter for screening for lipid disorder    Need for vaccination    Migraine       Current Medications:  Current Outpatient Medications   Medication Sig Dispense Refill    Acetaminophen (TYLENOL PO) Take by mouth      azelastine (ASTELIN) 0.1 % nasal spray 2 sprays by Nasal route 2 (two) times daily 2 sprays each nostril twice a day as needed 30 mL 4    Fexofenadine HCl (ALLEGRA PO) Take by mouth      montelukast (SINGULAIR) 10 MG tablet Take 1 tablet (10 mg total) by mouth daily 90 tablet 3    SUMAtriptan (Imitrex) 100 MG tablet Take 1 tablet (100 mg total) by mouth every 2 (two) hours as needed for Migraine Take at onset of migraine;  May repeat in 2 hrs - max dosing 200mg /24 hrs 18 tablet 0    clotrimazole-betamethasone (LOTRISONE) cream Apply topically 2 (two) times daily Apply to affected area 2 times daily until rash goes away 30 g 1    Pseudoephedrine-DM-GG (SUDAFED COUGH PO) Take by mouth       No current facility-administered medications for this visit.       Allergies:  No Known Allergies    Past Medical History:  Past Medical History:   Diagnosis Date    Seasonal  allergic rhinitis        Past Surgical History:  History reviewed. No pertinent surgical history.    Family History:  Family History   Problem Relation Age of Onset    Hypertension Mother     Diabetes Father     Other Father         Cardiac Arrest       Social History:  Social History     Socioeconomic History    Marital status: Single     Spouse name: Not on file    Number of children: Not on file    Years of education: Not on file    Highest education level: Not on file   Occupational History    Not on file   Tobacco Use    Smoking status: Never Smoker    Smokeless tobacco: Never Used    Tobacco comment: just tried " a long time ago"   Vaping Use    Vaping Use: Never used   Substance and Sexual Activity    Alcohol use: Yes     Comment: socailly    Drug use: No    Sexual activity: Yes  Partners: Female     Birth control/protection: Condom   Other Topics Concern    Not on file   Social History Narrative    Not on file     Social Determinants of Health     Financial Resource Strain:     Difficulty of Paying Living Expenses:    Food Insecurity:     Worried About Programme researcher, broadcasting/film/video in the Last Year:     Barista in the Last Year:    Transportation Needs:     Freight forwarder (Medical):     Lack of Transportation (Non-Medical):    Physical Activity:     Days of Exercise per Week:     Minutes of Exercise per Session:    Stress:     Feeling of Stress :    Social Connections:     Frequency of Communication with Friends and Family:     Frequency of Social Gatherings with Friends and Family:     Attends Religious Services:     Active Member of Clubs or Organizations:     Attends Banker Meetings:     Marital Status:    Intimate Partner Violence:     Fear of Current or Ex-Partner:     Emotionally Abused:     Physically Abused:     Sexually Abused:        The following sections were reviewed this encounter by the provider: Meds              Vitals:  BP 112/76 (BP  Site: Right arm, Patient Position: Sitting, Cuff Size: Medium)    Pulse (!) 120    Temp 97.3 F (36.3 C) (Temporal)    Resp 18    Wt 75.5 kg (166 lb 6.4 oz)    SpO2 99%    BMI 25.30 kg/m     ROS:  General/Constitutional:           Denies Chills.  Denies Fatigue.  Denies Fever.       Ophthalmologic:           Denies Blurred vision.       ENT:           Denies Nasal Discharge.  Denies Sinus pain.  Denies Sore throat.       Endocrine:           Denies Polydipsia.  Denies Polyuria.       Respiratory:           Denies Cough.  Denies Orthopnea.  Denies Shortness of breath.  Denies Wheezing.       Cardiovascular:           Denies Chest pain.  Denies Chest pain with exertion.  Denies Palpitations.  Denies Swelling in hands/feet.       Gastrointestinal:           Denies Abdominal pain.  Denies Constipation.  Denies Diarrhea.  Denies Nausea.  Denies Vomiting.       Hematology:           Denies Easy bruising.  Denies Easy Bleeding.       Genitourinary:           Denies Blood in urine.  Denies Painful urination.       Peripheral Vascular:           Denies Pain/cramping in legs after exertion.  Denies Painful extremities.       Skin:  Denies Rash.       Neurologic:           Denies Dizziness.  Denies Pre-Syncope.  Denies Tingling/Numbness.       Psychiatric:           Denies Anxiety.  Denies Depressed mood.       Objective:     Examination:   General Examination:  GENERAL APPEARANCE: alert, in no acute distress, well developed, well nourished, oriented to time, place, and person.   HEAD: normal appearance.   EYES: extraocular movement intact (EOMI), pupils equal, round, reactive to light and accommodation, sclera anicteric.   EARS: tympanic membranes normal bilaterally, external canals normal .   NOSE: normal nasal mucosa, nares patent.   ORAL CAVITY: normal oropharynx.   THROAT: no erythema, no exudate.   NECK/THYROID: neck supple, carotid pulse 2+ bilaterally, no lymphadenopathy, no thyromegaly.   SKIN: no rashes.    HEART: S1, S2 normal, no murmurs, rubs, gallops, regular rate and rhythm.   LUNGS: normal effort / no distress, normal breath sounds, clear to auscultation bilaterally, no wheezes, rales, rhonchi.   ABDOMEN: bowel sounds present, no hepatosplenomegaly, soft, nontender, nondistended.   EXTREMITIES: no clubbing, cyanosis, or edema.   PERIPHERAL PULSES: 2+ dorsalis pedis, 2+ posterior tibial.   NEUROLOGIC: nonfocal, cranial nerves 2-12 grossly intact, deep tendon reflexes 2+ symmetrical, normal strength, tone and reflexes, sensory exam intact.   PSYCH: alert, oriented, cognitive function intact.    Assessment:     1. Preop exam for internal medicine    2. Preoperative testing  - CBC and differential  - Comprehensive metabolic panel  - Prothrombin time/INR      Plan:   Pre-Operative Evaluation:  Surgery Specific Risk:  Low (endoscopic, superficial, breast, opthalmologic, minor orthopedic)  - ASA Classification:    Class 1 - Healthy patient.  - Electrocardiogram:   normal EKG, normal sinus rhythm, no acute ST-T changes, not indicated .  - There are no active cardiopulmonary symptoms.  - Chronic medical condition(s):   no chronic medical issues to monitor  - Exercise tolerance is greater than 4 mets.   - Perioperative Medication Recommendations:   PreopMedRecs: ASA - Pt adivsed to stop ASA at least numbers: 7 days prior to surgery. NSAID's - Pt advised to stop NSAID at least numbers: 7 days prior to surgery. COX-2 inhibitors - Pt advised to stop COX-2 inhibitor therapy at least numbers: 7 days prior to surgery. Herbal supplements - Pt advised to stop all herbals and supplements at least one week before surgery.  - Pt has no active cardiovascular symptoms and has good functional capacity.  Pt cleared for surgery with acceptable risk.  - Pt is at low risk for peri-operative cardiovascular complications during planned low risk surgical procedure.      Return in about 3 months (around 03/12/2020).    Herbert Deaner, MD

## 2019-12-12 LAB — COMPREHENSIVE METABOLIC PANEL
ALT: 31 U/L (ref 0–55)
AST (SGOT): 22 U/L (ref 5–34)
Albumin/Globulin Ratio: 1.4 (ref 0.9–2.2)
Albumin: 4.3 g/dL (ref 3.5–5.0)
Alkaline Phosphatase: 39 U/L (ref 38–106)
Anion Gap: 13 (ref 5.0–15.0)
BUN: 11 mg/dL (ref 9.0–28.0)
Bilirubin, Total: 0.8 mg/dL (ref 0.2–1.2)
CO2: 22 mEq/L (ref 21–29)
Calcium: 9.5 mg/dL (ref 8.5–10.5)
Chloride: 104 mEq/L (ref 100–111)
Creatinine: 1.2 mg/dL (ref 0.5–1.5)
Globulin: 3.1 g/dL (ref 2.0–3.7)
Glucose: 79 mg/dL (ref 70–100)
Potassium: 4 mEq/L (ref 3.5–5.1)
Protein, Total: 7.4 g/dL (ref 6.0–8.3)
Sodium: 139 mEq/L (ref 136–145)

## 2019-12-12 LAB — HEMOLYSIS INDEX: Hemolysis Index: 11 (ref 0–18)

## 2019-12-12 LAB — GFR: EGFR: >60

## 2020-07-16 ENCOUNTER — Encounter (INDEPENDENT_AMBULATORY_CARE_PROVIDER_SITE_OTHER): Payer: Self-pay | Admitting: Internal Medicine

## 2020-07-16 ENCOUNTER — Ambulatory Visit (INDEPENDENT_AMBULATORY_CARE_PROVIDER_SITE_OTHER): Payer: BLUE CROSS/BLUE SHIELD | Admitting: Internal Medicine

## 2020-07-16 VITALS — BP 107/68 | HR 69 | Temp 98.2°F | Resp 18 | Wt 166.4 lb

## 2020-07-16 DIAGNOSIS — Z113 Encounter for screening for infections with a predominantly sexual mode of transmission: Secondary | ICD-10-CM

## 2020-07-16 DIAGNOSIS — Z13220 Encounter for screening for lipoid disorders: Secondary | ICD-10-CM

## 2020-07-16 DIAGNOSIS — R140 Abdominal distension (gaseous): Secondary | ICD-10-CM

## 2020-07-16 DIAGNOSIS — R1013 Epigastric pain: Secondary | ICD-10-CM

## 2020-07-16 DIAGNOSIS — Z0000 Encounter for general adult medical examination without abnormal findings: Secondary | ICD-10-CM

## 2020-07-16 DIAGNOSIS — Z1329 Encounter for screening for other suspected endocrine disorder: Secondary | ICD-10-CM

## 2020-07-16 DIAGNOSIS — R194 Change in bowel habit: Secondary | ICD-10-CM

## 2020-07-16 DIAGNOSIS — Z131 Encounter for screening for diabetes mellitus: Secondary | ICD-10-CM

## 2020-07-16 DIAGNOSIS — G43911 Migraine, unspecified, intractable, with status migrainosus: Secondary | ICD-10-CM

## 2020-07-16 DIAGNOSIS — R053 Chronic cough: Secondary | ICD-10-CM

## 2020-07-16 MED ORDER — RIMEGEPANT SULFATE 75 MG PO TBDP
1.00 | ORAL_TABLET | ORAL | 1 refills | Status: DC | PRN
Start: 2020-07-16 — End: 2021-08-16

## 2020-07-16 NOTE — Progress Notes (Signed)
Have you seen any specialists/other providers since your last visit with us?    No    Arm preference verified?   Yes    The patient is due for influenza vaccine

## 2020-07-16 NOTE — Progress Notes (Signed)
Subjective:      Date: 07/16/2020 7:02 AM   Patient ID: Thomas Carr is a 35 y.o. male.    Chief Complaint:  Chief Complaint   Patient presents with    Annual Exam     pt fasting        HPI  Visit Type: Health Maintenance Visit  Work Status: working full-time  Reported Health: good health  Reported Diet: compliant with recommended diet, low sodium diet, low fat/cholesterol diet, low calorie diet and well-balanced diet, less fried foods and changed to diet drinks  Reported Exercise: recently not as regularly  Dental: regular dental visits twice a year  Vision: no vision correction needed and glasses  Hearing: normal hearing  Immunization Status: immunizations up to date and covid vax x 3, no flu vax  Reproductive Health: sexually active, using safe sex practices: yes and History of STD: negative  Prior Screening Tests: no prior PSA testing, prostate cancer screening not appropriate at this time, no previous colorectal cancer screening, colon cancer screening not approriate at this time, no previous dexa scan and dexa scan not appropriate at this time  General Health Risks: no family history of prostate cancer, no family history of colon cancer and no family history of breast cancer  Safety Elements Used: uses seat belts, smoke detectors in household, carbon monoxide detectors in household, sunscreen use, does not text and drive and no guns at home    Problem List:  Patient Active Problem List   Diagnosis    Encounter for screening for lipid disorder    Need for vaccination    Migraine       Current Medications:  Current Outpatient Medications   Medication Sig Dispense Refill    Acetaminophen (TYLENOL PO) Take by mouth      diclofenac (VOLTAREN) 75 MG EC tablet Take 75 mg by mouth as needed      Fexofenadine HCl (ALLEGRA PO) Take by mouth      montelukast (SINGULAIR) 10 MG tablet Take 1 tablet (10 mg total) by mouth daily 90 tablet 3    clotrimazole-betamethasone (LOTRISONE) cream Apply topically 2 (two)  times daily Apply to affected area 2 times daily until rash goes away 30 g 1    Rimegepant Sulfate 75 MG Tablet Dispersible Take 1 tablet (75 mg total) by mouth as needed (migraine) 6 tablet 1     No current facility-administered medications for this visit.       Allergies:  No Known Allergies    Past Medical History:  Past Medical History:   Diagnosis Date    Seasonal allergic rhinitis        Past Surgical History:  History reviewed. No pertinent surgical history.    Family History:  Family History   Problem Relation Age of Onset    Hypertension Mother     Diabetes Father     Other Father         Cardiac Arrest       Social History:  Social History     Socioeconomic History    Marital status: Single   Tobacco Use    Smoking status: Never Smoker    Smokeless tobacco: Never Used    Tobacco comment: just tried " a long time ago"   Vaping Use    Vaping Use: Never used   Substance and Sexual Activity    Alcohol use: Yes     Comment: socailly    Drug use: No    Sexual  activity: Yes     Partners: Female     Birth control/protection: Condom     Social Determinants of Psychologist, prison and probation services Strain: Low Risk     Difficulty of Paying Living Expenses: Not very hard   Food Insecurity: No Food Insecurity    Worried About Programme researcher, broadcasting/film/video in the Last Year: Never true    Barista in the Last Year: Never true   Transportation Needs: No Transportation Needs    Lack of Transportation (Medical): No    Lack of Transportation (Non-Medical): No   Physical Activity: Unknown    Days of Exercise per Week: 5 days   Stress: Stress Concern Present    Feeling of Stress : Rather much   Social Connections: Moderately Integrated    Frequency of Communication with Friends and Family: More than three times a week    Frequency of Social Gatherings with Friends and Family: Never    Attends Religious Services: More than 4 times per year    Active Member of [employer] or Organizations: Yes    Attends Museum/gallery exhibitions officer: More than 4 times per year    Marital Status: Never married   Catering manager Violence: Not At Risk    Fear of Current or Ex-Partner: No    Emotionally Abused: No    Physically Abused: No    Sexually Abused: No   Housing Stability: Low Risk     Unable to Pay for Housing in the Last Year: No    Number of Places Lived in the Last Year: 1    Unstable Housing in the Last Year: No        The following sections were reviewed this encounter by the provider:   Tobacco   Allergies   Meds   Problems   Med Hx   Surg Hx   Fam Hx          Review of Systems   Constitutional: Negative.    HENT: Negative.    Eyes: Negative.    Respiratory: Negative.    Cardiovascular: Negative.    Gastrointestinal: Negative.    Endocrine: Negative.    Genitourinary: Negative.    Musculoskeletal: Negative.    Skin: Negative.    Allergic/Immunologic: Negative.  Negative for immunocompromised state.   Neurological: Negative.    Hematological: Negative.    Psychiatric/Behavioral: Negative.        Objective:     Vitals:  BP 107/68 (BP Site: Right arm, Patient Position: Sitting, Cuff Size: Medium)    Pulse 69    Temp 98.2 F (36.8 C) (Temporal)    Resp 18    Wt 75.5 kg (166 lb 6.4 oz)    SpO2 98%    BMI 25.30 kg/m   BP Readings from Last 3 Encounters:   07/16/20 107/68   12/11/19 112/76   09/06/19 132/88       Examination:   General Examination:   GENERAL APPEARANCE: alert, in no acute distress, well developed, well nourished, oriented to time, place, and person.   HEAD: normal appearance, atraumatic.   EYES: extraocular movement intact (EOMI), pupils equal, round, reactive to light and accommodation, sclera anicteric, conjunctiva clear.   EARS: tympanic membranes normal bilaterally, external canals normal .   NOSE: normal nasal mucosa, no lesions.   ORAL CAVITY: normal oropharynx, normal lips, mucosa moist, no lesions.   THROAT: normal appearance, clear, no erythema.   NECK/THYROID: neck supple, no carotid  bruit,  carotid pulse 2+ bilaterally, no cervical lymphadenopathy, no neck mass palpated, no jugular venous distention, no thyromegaly.   LYMPH NODES: no palpable adenopathy.   SKIN: good turgor, no rashes, no suspicious lesions.   HEART: S1, S2 normal, no murmurs, rubs, gallops, regular rate and rhythm.   LUNGS: normal effort / no distress, normal breath sounds, clear to auscultation bilaterally, no wheezes, rales, rhonchi.   ABDOMEN: bowel sounds present, no hepatosplenomegaly, soft, nontender, nondistended.   RECTAL: normal tone, no masses palpable, prostate normal.   MALE GENITOURINARY: no penile lesions or discharge, no testicular mass.   MUSCULOSKELETAL: full range of motion, no swelling or deformity.   EXTREMITIES: no edema, no clubbing, cyanosis, or edema.   PERIPHERAL PULSES: 2+ dorsalis pedis, 2+ posterior tibial.   NEUROLOGIC: nonfocal, cranial nerves 2-12 grossly intact, deep tendon reflexes 2+ symmetrical, normal strength, tone and reflexes, sensory exam intact.   PSYCH: cognitive function intact, mood/affect full range, speech clear.     Lab Results   Component Value Date    WBC 4.36 07/17/2020    HGB 15.0 07/17/2020    HCT 45.1 07/17/2020    PLT 320 07/17/2020    CHOL 147 07/17/2020    TRIG 45 07/17/2020    HDL 42 07/17/2020    LDL 96 07/17/2020    ALT 29 07/17/2020    AST 27 07/17/2020    NA 136 07/17/2020    K 4.0 07/17/2020    CL 103 07/17/2020    CREAT 0.9 07/17/2020    BUN 10.0 07/17/2020    CO2 25 07/17/2020    TSH 0.98 07/17/2020    INR 1.1 12/11/2019    GLU 90 07/17/2020    HGBA1C 4.4 (L) 07/17/2020     Assessment/Plan:       1. Physical exam, annual  - CBC and differential; Future  - Comprehensive metabolic panel; Future  - Lipid panel; Future  - TSH; Future  - Hemoglobin A1C; Future    2. Intractable migraine with status migrainosus, unspecified migraine type  - CBC and differential; Future  - Comprehensive metabolic panel; Future  - Rimegepant Sulfate 75 MG Tablet Dispersible; Take 1 tablet (75 mg  total) by mouth as needed (migraine)  Dispense: 6 tablet; Refill: 1    3. Screening for thyroid disorder  - CBC and differential; Future  - Comprehensive metabolic panel; Future  - TSH; Future    4. Encounter for screening for lipid disorder  - CBC and differential; Future  - Comprehensive metabolic panel; Future  - Lipid panel; Future    5. Screening for diabetes mellitus  - CBC and differential; Future  - Comprehensive metabolic panel; Future  - Hemoglobin A1C; Future    6. Chronic cough  - ENT Referral: Cecilie Kicks, MD ([facility] [place] [facility]); Future    7. Screen for STD (sexually transmitted disease)  - Urine Chlamydia/       GC by PCR; Future  - Syphilis Screen IgG and IgM; Future  - HSV Type 1 and 2 IgG; Future  - HIV-1/2 Ag/Ab 4th Gen. w/ Reflex; Future  - Hepatitis C (HCV) antibody, Total; Future  - Hepatitis B (HBV) Surface Antigen w/ Reflex to Confirmation; Future    8. Bloating symptom  - Referral to Gastroenterology - EXTERNAL    9. Dyspepsia  - Referral to Gastroenterology - EXTERNAL    10. Change in bowel habits  - Referral to Gastroenterology - EXTERNAL      Health  Maintenance:  High BMI follow-up: Encouragement to Exercise. Recommend optimizing low carbohydrate diet efforts and obtaining at least 150 minutes of aerobic exercise per week. Recommend 20-25 grams of dietary fiber daily. Recommend drinking at least 60-80 ounces of water per day. Recommend optimizing low sodium diet measures ( less than 2 grams of sodium in the diet per day ). Obtain a nutrition consult to help individualize dietary recommendations to help achieve glycemic and/or lipid goals Obtain a naturopathic consult to help individualize dietary and supplement  recommendations to help achieve glycemic and/or lipid goalsImmunizations UTD. Vision screening UTD. Dental Screening UTD. Colonoscopy is UTD.   Check fasting blood work as above  Reviewed previous fasting blood work results with patient  Reviewed recent  fasting blood work with patient and compared to previous results    HTN:  Current blood pressure is adequately controlled on the current medication regimen. Current blood pressure is at goal (goal SBP<130; DBP<80). Continue current medication regimen. Recommend optimizing therapeutic lifestyle changes which include obtaining at least 150 minutes of aerobic exercise per week and eating a heart healthy diet ( i.e. DASH diet - www.heart.org or PopSteam.is ). Recommend out-of-office blood pressure measurements to confirm diagnosis of HTN. Recommend out-of-office blood pressure measurements for titration of BP-lowering medication. Recommend checking ambulatory blood pressures once or twice a day and document in a log.  Bring the log and blood pressure cuff into the office at the next follow-up visit or utilize MyChart to communicate BP readings every 2-4 weeks.  Return in about 1 year (around 07/16/2021), or if symptoms worsen or fail to improve, for physical exam in one year.    Dyslipidemia:  Lab Results   Component Value Date    CHOL 147 07/17/2020    CHOL 162 06/21/2019    TRIG 45 07/17/2020    TRIG 38 06/21/2019    HDL 42 07/17/2020    HDL 48 06/21/2019    LDL 96 07/17/2020    LDL 106 (H) 06/21/2019     Recommend patient consume a healthy diet that emphasizes the intake of vegetables, fruits, nuts, whole grains, lean vegetable or animal protein, and fish and minimizes the intake of trans fats, red meat and process meats, refined carbohydrates, and sugar sweetened beverages.  Recommend patient engage in at least 150 minutes per week of accumulated moderate-intensity physical activity or 75 minutes per week of vigorous-intensity physical activity.  Prior to adding lipid-lowering medication, recommend nutrition and/or naturopathic physician consult to help individualize personal recommendations for diet modification.  awaiting lab results   Herbert Deaner, MD  The following activities were performed on the date of  service:  preparing to see the patient: - chart review   - review of prior labs  - review of prior imaging tests  obtaining and/or reviewing the separately obtained history  performing a medically appropriate examination and/or evaluation   counseling and educating the patient/family/caregiver  ordering medications, tests, or procedures  documenting clinical information in the electronic or other health records  independently interpreting results (not separately reported) and communicating results to the patient/family/caregiver  care coordination (not separately reported)    Total time spent performing activities on date of service:  35  minutes

## 2020-07-17 ENCOUNTER — Other Ambulatory Visit (FREE_STANDING_LABORATORY_FACILITY): Payer: BLUE CROSS/BLUE SHIELD

## 2020-07-17 ENCOUNTER — Telehealth (INDEPENDENT_AMBULATORY_CARE_PROVIDER_SITE_OTHER): Payer: Self-pay | Admitting: Internal Medicine

## 2020-07-17 DIAGNOSIS — Z1329 Encounter for screening for other suspected endocrine disorder: Secondary | ICD-10-CM

## 2020-07-17 DIAGNOSIS — Z131 Encounter for screening for diabetes mellitus: Secondary | ICD-10-CM

## 2020-07-17 DIAGNOSIS — Z13220 Encounter for screening for lipoid disorders: Secondary | ICD-10-CM

## 2020-07-17 DIAGNOSIS — Z0000 Encounter for general adult medical examination without abnormal findings: Secondary | ICD-10-CM

## 2020-07-17 DIAGNOSIS — Z113 Encounter for screening for infections with a predominantly sexual mode of transmission: Secondary | ICD-10-CM

## 2020-07-17 DIAGNOSIS — G43911 Migraine, unspecified, intractable, with status migrainosus: Secondary | ICD-10-CM

## 2020-07-17 LAB — CBC AND DIFFERENTIAL
Absolute NRBC: 0 10*3/uL (ref 0.00–0.00)
Basophils Absolute Automated: 0.02 10*3/uL (ref 0.00–0.08)
Basophils Automated: 0.5 %
Eosinophils Absolute Automated: 0.08 10*3/uL (ref 0.00–0.44)
Eosinophils Automated: 1.8 %
Hematocrit: 45.1 % (ref 37.6–49.6)
Hgb: 15 g/dL (ref 12.5–17.1)
Immature Granulocytes Absolute: 0.01 10*3/uL (ref 0.00–0.07)
Immature Granulocytes: 0.2 %
Lymphocytes Absolute Automated: 1.82 10*3/uL (ref 0.42–3.22)
Lymphocytes Automated: 41.7 %
MCH: 28.2 pg (ref 25.1–33.5)
MCHC: 33.3 g/dL (ref 31.5–35.8)
MCV: 84.9 fL (ref 78.0–96.0)
MPV: 10.9 fL (ref 8.9–12.5)
Monocytes Absolute Automated: 0.3 10*3/uL (ref 0.21–0.85)
Monocytes: 6.9 %
Neutrophils Absolute: 2.13 10*3/uL (ref 1.10–6.33)
Neutrophils: 48.9 %
Nucleated RBC: 0 /100 WBC (ref 0.0–0.0)
Platelets: 320 10*3/uL (ref 142–346)
RBC: 5.31 10*6/uL (ref 4.20–5.90)
RDW: 15 % (ref 11–15)
WBC: 4.36 10*3/uL (ref 3.10–9.50)

## 2020-07-17 LAB — LIPID PANEL
Cholesterol / HDL Ratio: 3.5
Cholesterol: 147 mg/dL (ref 0–199)
HDL: 42 mg/dL (ref 40–9999)
LDL Calculated: 96 mg/dL (ref 0–99)
Triglycerides: 45 mg/dL (ref 34–149)
VLDL Calculated: 9 mg/dL — ABNORMAL LOW (ref 10–40)

## 2020-07-17 LAB — COMPREHENSIVE METABOLIC PANEL
ALT: 29 U/L (ref 0–55)
AST (SGOT): 27 U/L (ref 5–34)
Albumin/Globulin Ratio: 1.4 (ref 0.9–2.2)
Albumin: 4 g/dL (ref 3.5–5.0)
Alkaline Phosphatase: 36 U/L — ABNORMAL LOW (ref 37–117)
Anion Gap: 8 (ref 5.0–15.0)
BUN: 10 mg/dL (ref 9.0–28.0)
Bilirubin, Total: 0.6 mg/dL (ref 0.2–1.2)
CO2: 25 mEq/L (ref 21–29)
Calcium: 9 mg/dL (ref 8.5–10.5)
Chloride: 103 mEq/L (ref 100–111)
Creatinine: 0.9 mg/dL (ref 0.5–1.5)
Globulin: 2.8 g/dL (ref 2.0–3.7)
Glucose: 90 mg/dL (ref 70–100)
Potassium: 4 mEq/L (ref 3.5–5.1)
Protein, Total: 6.8 g/dL (ref 6.0–8.3)
Sodium: 136 mEq/L (ref 136–145)

## 2020-07-17 LAB — GFR: EGFR: >60

## 2020-07-17 LAB — HEPATITIS B SURFACE ANTIGEN W/ REFLEX TO CONFIRMATION: Hepatitis B Surface Antigen: NONREACTIVE

## 2020-07-17 LAB — HEMOLYSIS INDEX: Hemolysis Index: 4 (ref 0–24)

## 2020-07-17 LAB — HEPATITIS C ANTIBODY: Hepatitis C, AB: NONREACTIVE

## 2020-07-17 LAB — HEMOGLOBIN A1C
Average Estimated Glucose: 79.6 mg/dL
Hemoglobin A1C: 4.4 % — ABNORMAL LOW (ref 4.6–5.9)

## 2020-07-17 LAB — TSH: TSH: 0.98 u[IU]/mL (ref 0.35–4.94)

## 2020-07-17 LAB — HIV-1/2 AG/AB 4TH GEN. W/ REFLEX: HIV Ag/Ab, 4th Generation: NONREACTIVE

## 2020-07-17 LAB — SYPHILIS SCREEN IGG AND IGM: Syphilis Screen IgG and IgM: NONREACTIVE

## 2020-07-18 LAB — HSV TYPE 1 AND 2 IGG
Herpes Antibody Type 1 IgG: NEGATIVE
Herpes Antibody Type 2 IgG: NEGATIVE

## 2020-07-20 LAB — URINE CHLAMYDIA/NEISSERIA BY PCR
Chlamydia DNA by PCR: NEGATIVE
Neisseria gonorrhoeae by PCR: NEGATIVE

## 2020-07-27 ENCOUNTER — Encounter (INDEPENDENT_AMBULATORY_CARE_PROVIDER_SITE_OTHER): Payer: Self-pay | Admitting: Internal Medicine

## 2020-09-09 NOTE — Progress Notes (Signed)
OFFICE   [address] [place], [place] [address]           Thomas Carr    Date of Visit:  09/06/2019  Date of Birth: 09-09-1985  Age: 35 yrs.   Medical Record Number: [id number]  Referring Physician: Alen Blew MD, IVAN  __   CURRENT DIAGNOSES     1. Palpitations, R00.2  2. Family history of ischemic heart disease and other diseases of the circulatory system, Z82.49  3. Generalized Anxiety Disorder, F41.1   __  ALLERGIES    No Known Drug Allergies  __   MEDICATIONS     1. montelukast 10 mg tablet, 1 po qd  2. diclofenac sodium 75 mg tablet,delayed release, 1 po bid prn  3. rizatriptan 10 mg tablet, 1 po prn  4. azelastine 137 mcg (0.1  %) nasal spray aerosol, 2 sprays bid each nostril  5. tazarotene 0.1 % topical cream, nightly  __  CHIEF COMPLAINT/REASON FOR VISIT  Followup  of palps, weight  __  HISTORY OF PRESENT ILLNESS    35 year old male with past medical history of anxiety and palpitations coming in for follow-up.  Since her last visit patient had a Zio patch which showed normal sinus rhythm and an echocardiogram which was normal. Since then his symptoms have also resolved, he feels generally quite well. His only complaint is lack of sleep. Patient had a sleep study  showing mild sleep apnea, is not interested in wearing the mask. Patient thinks if he loses 10 to 15 pounds he may improve his sleep patterns. Patient has tried Ideal protein in the past, with little success. Patient denies chest pain, shortness of breath  and palpitations.      __  PAST HISTORY     Past Medical Illnesses : Anxiety 2018, Chondromalacia 2020;  Past Cardiac Illnesses: No previous history of cardiac disease.;  Infectious Diseases: Chicken Pox; Surgical Procedures: Wisdom teeth extracted 2013;  Trauma History: No previous history of significant trauma.; Cardiology Procedures-Invasive: No previous  interventional or invasive cardiology procedures.; Cardiology Procedures-Noninvasive: Continuous Ambulatory  Monitoring Device (CAM) December 2020, Echocardiogram  January 2021; Left Ventricular Ejection Fraction: LVEF of 65% documented via echocardiogram on 06/27/2019   ___  FAMILY HISTORY  Father -- Cardiomyopathy, Heart Attack (cardiac arrest; died), Diabetes mellitus type 2   MaternalGrandparent -- Pulmonary emphysema (grandfather), Atrial fibrillation (grandmother), Hypertension (grandmother)  Mother --  Hypertension, Diabetes mellitus type 2  PaternalGrandparent -- Alzheimer's disease (grandfather), Hypertension (grandmother)    __  CARDIAC  RISK FACTORS     Tobacco Abuse: has never used tobacco; Family History of Heart Disease : positive; Hyperlipidemia: negative; Hypertension : negative;  Diabetes Mellitus: negative; Prior History of Heart Disease : negative; Obesity: negative; Sedentary Life Style :negative; ONG:[id number]; Menopausal:not applicable   __  SOCIAL HISTORY    Alcohol Use : socially; Smoking: Does not smoke; Never smoker ([id number]); Diet : Regular diet and Caffeine use-1-2 per day; Exercise: Exercises regularly;   __  REVIEW OF  SYSTEMS    General: Denies recent weight loss, weight gain, fever or chills or change in exercise  tolerance.; Integumentary: Denies any change in hair or nails, rashes, or skin lesions.; Eyes : Denies diplopia, glaucoma or visual field defects.; Ears, Nose, Throat, Mouth: Denies any hearing loss, epistaxis, hoarseness or difficulty speaking.; Respiratory: Denies dyspnea, cough, wheezing or hemoptysis.; Cardiovascular: Please review HPI;  Abdominal : Denies ulcer disease, hematochezia or melena.;Musculoskeletal:Denies any venous insufficiency,  arthritic symptoms or back  problems.; Neurological : Denies any recurrent strokes, TIA, or seizure disorder.;  Psychiatric: Denies any depression, substance abuse or change in cognitive functions.; Endocrine: Denies  any weight change, heat/cold intolerance, polydipsia, or polyuria; Hematologic/Immunologic: Denies any food allergies,  seasonal allergies, bleeding disorders.   __  PHYSICAL EXAMINATION    Vital Signs:  Blood Pressure:   132/88 Sitting, Left arm, regular cuff    Weight: 168.00 lbs.  Height:  68.00"  BMI: 25.54   Pulse: 82/min.        Constitutional: Cooperative, alert and oriented,well developed, well nourished, in no acute distress. Skin:  Warm and dry to touch, no apparent skin lesions, or masses noted. Head: Normocephalic, normal hair pattern, no masses or tenderness  Eyes: EOMS Intact, PERRL, conjunctivae and lids normal. Funduscopic exam and visual fields not performed. ENT : Ears, Nose and throat reveal no gross abnormalities. No pallor or cyanosis. Dentition good. Neck: No palpable masses or adenopathy, no thyromegaly,  no JVD, carotid pulses are full and equal bilaterally without bruits. Chest: Normal symmetry, no tenderness to palpation, normal respiratory excursion,  no intercostal retraction, no use of accessory muscles, normal diaphragmatic excursion, clear to auscultation and percussion. Cardiac: Regular rhythm,  S1 normal, S2 normal, No S3 or S4, Apical impulse not displaced, no murmurs, gallops or rubs detected. Abdomen: Abdomen soft, bowel sounds normoactive,  no masses, no hepatosplenomegaly, non-tender, no bruits Peripheral Pulses: The femoral, popliteal, dorsalis pedis, and posterior tibial pulses are full  and equal bilaterally with no bruits auscultated. Extremities/Back: No deformities, clubbing, cyanosis, erythema or edema observed. There are no spinal  abnormalities noted. Normal muscle strength and tone. Neurological: No gross motor or sensory deficits noted, affect appropriate, oriented to time, person  and place.   __    Medications added today by the physician:      ECG:  Reviewed again by me. Normal sinus rhythm.    IMPRESSIONS:    35 year old male with past medical history of anxiety and palpitations coming  in for follow-up. Patient has normal echo and Zio patch. Patient's palpitations have gone away.  Patient is having trouble sleeping with mild sleep apnea on recent sleep study. Patient does not want to wear CPAP, is going to try to lose weight. We discussed  calorie counting and intermittent fasting.      RECOMMENDATIONS:    1. Palpitations  -Have resolved  -Normal echo and Zio patch    2. Sleep disturbance  -Mild sleep apnea, not interested in CPAP currently  -Discussed  weight loss strategies such as calorie counting and intermittent fasting      This note was generated by the [facility] EMR system/Dragon speech recognition and may contain errors or omissions not intended by the user. Grammatical errors, random  word insertions, deletions, pronoun errors, and incomplete sentences are occasional consequences of this technology due to software limitations. Not all errors are caught or corrected. If there are questions or concerns about the content of this note  or information contained within the body of this dictation, they should be addressed directly with the author for clarification.      Andree Coss, DO      cc: Buelah Manis MD

## 2020-09-09 NOTE — H&P (Signed)
[facility]   [address] [place], [place] [address]           Thomas Carr    Date of Visit:  06/04/2019  Date of Birth: [birth year]  Age: 35 yrs.   Medical Record Number: [id number]  Referring Physician: Alen Blew MD, IVAN  __   CURRENT DIAGNOSES     1. Palpitations, R00.2  2. Family history of ischemic heart disease and other diseases of the circulatory system, Z82.49  3. Generalized Anxiety Disorder, F41.1   __  ALLERGIES    No Known Drug Allergies  __   MEDICATIONS     1. azelastine 137 mcg (0.1 %) nasal spray aerosol, 2 sprays bid each nostril  2. diclofenac sodium 75 mg tablet,delayed release, 1 po bid prn  3. montelukast 10 mg tablet,  1 po qd  4. rizatriptan 10 mg tablet, 1 po prn  5. tazarotene 0.1 % topical cream, nightly  __  CHIEF COMPLAINT/REASON FOR VISIT  palpitations   __  HISTORY OF PRESENT ILLNESS    35 year old male with past medical history of anxiety coming in for palpitations. Patient is referred for consultation  by Dr. Alen Blew. Patient states palpitations began 1 month ago, and are occurring frequently throughout the day. Patient feels the palpitations most at nighttime. Patient feels his heart beating faster and stronger. Patient does not have associated chest  pressure. Patient does not have any decrease in exercise tolerance. Patient is also noticed that his heart rate is elevated when he is walking, up to 120 bpm. Patient denies shortness of breath at rest or with exertion. Patient's father died of sudden  cardiac death in his 67s, was found to have enlarged heart on autopsy. Patient with no other family history of heart disease. Patient works at [employer], is at home. Patient denies fever, night sweats and chills. Patient has never had palpitations like  this before.      __  PAST HISTORY     Past Medical Illnesses : Anxiety 2018, Chondromalacia 2020;  Past Cardiac Illnesses: No previous history of cardiac disease.;  Infectious Diseases: Chicken Pox; Surgical Procedures:  Wisdom teeth extracted 2013;  Trauma History: No previous history of significant trauma.; Cardiology Procedures-Invasive: No previous  interventional or invasive cardiology procedures.; Cardiology Procedures-Noninvasive: No previous non-invasive cardiovascular testing.   ___  FAMILY HISTORY  Father -- Cardiomyopathy, Heart Attack (cardiac arrest; died), Diabetes mellitus type 2   MaternalGrandparent -- Pulmonary emphysema (grandfather), Atrial fibrillation (grandmother), Hypertension (grandmother)  Mother --  Hypertension, Diabetes mellitus type 2  PaternalGrandparent -- Alzheimer's disease (grandfather), Hypertension (grandmother)    __  CARDIAC  RISK FACTORS     Tobacco Abuse: has never used tobacco; Family History of Heart Disease : positive; Hyperlipidemia: negative; Hypertension : negative;  Diabetes Mellitus: negative; Prior History of Heart Disease : negative; Obesity: negative; Sedentary Life Style :negative; JWJ:XBJYNWGN; Menopausal:not applicable   __  SOCIAL HISTORY    Alcohol Use : socially; Smoking: Does not smoke; Never smoker ([id number]); Diet : Regular diet and Caffeine use-1-2 per day; Exercise: Exercises regularly;   __  REVIEW OF  SYSTEMS    General: Denies recent weight loss, weight gain, fever or chills or change in exercise  tolerance.; Integumentary: Denies any change in hair or nails, rashes, or skin lesions.; Eyes : Denies diplopia, glaucoma or visual field defects.; Ears, Nose, Throat, Mouth: Denies any hearing loss, epistaxis, hoarseness or difficulty speaking.; Respiratory: Denies dyspnea, cough, wheezing or hemoptysis.; Cardiovascular: Please  review HPI;  Abdominal : Denies ulcer disease, hematochezia or melena.;Musculoskeletal:Denies any venous insufficiency,  arthritic symptoms or back problems.; Neurological : Denies any recurrent strokes, TIA, or seizure disorder.;  Psychiatric: Denies any depression, substance abuse or change in cognitive functions.; Endocrine: Denies  any weight  change, heat/cold intolerance, polydipsia, or polyuria; Hematologic/Immunologic: Denies any food allergies, seasonal allergies, bleeding disorders.   __  PHYSICAL EXAMINATION    Vital Signs:  Blood Pressure:   150/84 Sitting, Right arm, regular cuff  152/86 Sitting, Left arm, regular cuff    Weight: 166.80 lbs.   Height: 68"  BMI: 25.36  Temperature : 97.3   Pulse: 79/min. EKG       Constitutional:  Cooperative, alert and oriented,well developed, well nourished, in no acute distress. Skin: Warm and dry to touch, no apparent skin lesions, or masses  noted. Head: Normocephalic, normal hair pattern, no masses or tenderness Eyes : EOMS Intact, PERRL, conjunctivae and lids normal. Funduscopic exam and visual fields not performed. ENT: Ears, Nose and throat reveal no gross abnormalities.  No pallor or cyanosis. Dentition good. Neck: No palpable masses or adenopathy, no thyromegaly, no JVD, carotid pulses are full and equal bilaterally  without bruits. Chest: Normal symmetry, no tenderness to palpation, normal respiratory excursion, no intercostal retraction, no use of accessory muscles,  normal diaphragmatic excursion, clear to auscultation and percussion. Cardiac: Regular rhythm, S1 normal, S2 normal, No S3 or S4, Apical impulse not  displaced, no murmurs, gallops or rubs detected. Abdomen: Abdomen soft, bowel sounds normoactive, no masses, no hepatosplenomegaly, non-tender, no bruits  Peripheral Pulses: The femoral, popliteal, dorsalis pedis, and posterior tibial pulses are full and equal bilaterally with no bruits auscultated. Extremities/Back : No deformities, clubbing, cyanosis, erythema or edema observed. There are no spinal abnormalities noted. Normal muscle strength and tone. Neurological : No gross motor or sensory deficits noted, affect appropriate, oriented to time, person and place.   __    Medications added today by the physician:      ECG:  Reviewed by me. Normal sinus rhythm with early repolarization.      IMPRESSIONS:    35 year old man with past medical history of anxiety coming in with new onset palpitations and a strong family history of cardiomyopathy and sudden cardiac death. Patient symptoms sound most consistent with PACs, PVCs or anxiety.  We will order a Zio patch for event monitoring for further diagnosis. We will order 2D echocardiogram given Father died of cardiomyopathy and MI in a young age.      RECOMMENDATIONS:    1. Palpitations  -New problem  -We will  order a Zio patch for diagnosis  -Most likely PACs versus PVCs versus anxiety  -We will call patient with results    2. Family history of cardiomyopathy  -Father died of nonischemic cardiomyopathy in his 34s, question of acute myocardial  infarction as well  -We will get a screening echocardiogram    3. Anxiety  -May be contributing to palpitations  -Plan as above    This note was generated by the [facility] EMR system/Dragon speech recognition and may contain errors  or omissions not intended by the user. Grammatical errors, random word insertions, deletions, pronoun errors, and incomplete sentences are occasional consequences of this technology due to software limitations. Not all errors are caught or corrected.  If there are questions or concerns about the content of this note or information contained within the body of this dictation, they should be addressed directly with the Chartered loss adjuster  for clarification.      Andree Coss, DO       cc: Buelah Manis MD  ____________________________  TODAYS ORDERS  2D, color flow, doppler echocardiogram First Available  Diet_[facility],_guidance_and_counseling TODAY  12 Lead ECG Today  Continuous Ambulatory Monitor (14 Days) Today   Return Visit 15 MIN 3 months

## 2020-12-07 ENCOUNTER — Emergency Department
Admission: EM | Admit: 2020-12-07 | Discharge: 2020-12-07 | Disposition: A | Discharge status: Home / Self Care | Payer: BLUE CROSS/BLUE SHIELD | Attending: Emergency Medicine | Admitting: Emergency Medicine

## 2020-12-07 DIAGNOSIS — R079 Chest pain, unspecified: Secondary | ICD-10-CM

## 2020-12-07 DIAGNOSIS — B029 Zoster without complications: Secondary | ICD-10-CM | POA: Insufficient documentation

## 2020-12-07 HISTORY — DX: Dorsalgia, unspecified: M54.9

## 2020-12-07 HISTORY — DX: Migraine, unspecified, not intractable, without status migrainosus: G43.909

## 2020-12-07 LAB — URINALYSIS, REFLEX TO MICROSCOPIC EXAM IF INDICATED
Bilirubin, UA: NEGATIVE
Blood, UA: NEGATIVE
Glucose, UA: NEGATIVE
Ketones UA: NEGATIVE
Leukocyte Esterase, UA: NEGATIVE
Nitrite, UA: NEGATIVE
Protein, UR: NEGATIVE
Specific Gravity UA: 1.025 (ref 1.001–1.035)
Urine pH: 7 (ref 5.0–8.0)
Urobilinogen, UA: 1 mg/dL (ref 0.2–2.0)

## 2020-12-07 MED ORDER — IBUPROFEN 600 MG PO TABS
600.0000 mg | ORAL_TABLET | Freq: Four times a day (QID) | ORAL | 0 refills | Status: DC | PRN
Start: 2020-12-07 — End: 2021-08-16

## 2020-12-07 MED ORDER — HYDROCODONE-ACETAMINOPHEN 5-325 MG PO TABS
1.0000 | ORAL_TABLET | Freq: Four times a day (QID) | ORAL | 0 refills | Status: AC | PRN
Start: 2020-12-07 — End: 2020-12-14

## 2020-12-07 MED ORDER — VALACYCLOVIR HCL 1 G PO TABS
1000.0000 mg | ORAL_TABLET | Freq: Three times a day (TID) | ORAL | 0 refills | Status: AC
Start: 2020-12-07 — End: 2020-12-14

## 2020-12-07 NOTE — ED Triage Notes (Signed)
C/o R sided chest pain, L sided lower back pain that radiates x4 days.  Patient pain started after seeing chiropractor.  Denies PMH of heart problems.

## 2020-12-07 NOTE — ED Provider Notes (Signed)
EMERGENCY DEPARTMENT HISTORY AND PHYSICAL EXAM    PPE: Mask, face shield and gloves were worn every time I entered the room.     History of Presenting Illness:  History Provided By: Patient    Thomas Carr is a 35 y.o. male pw back pain and rash.  Patient reports symptoms started approximately 4 days ago.  Seem to occur after seeing a chiropractor.  Reports pain in the left lower back and right upper back.  Pain radiates to the front.  He reports hypersensitivity.  Rash started shortly after the pain.  Initially attributed to eczema.    Reviewed and confirmed past medical, surgical, family and social history as documented.    PCP: Herbert Deaner, MD  SPECIALISTS:    Review of Systems:  Review of Systems   Constitutional:  Negative for chills and fever.   Respiratory:  Negative for cough and shortness of breath.    Musculoskeletal:  Positive for back pain.   Skin:  Positive for rash.   All other systems reviewed as negative.    Physical Exam:  Vitals:    12/07/20 1040   BP: 118/78   Pulse: 88   Resp: 18   Temp: 98.2 F (36.8 C)   TempSrc: Oral   SpO2: 100%   Weight: 75.3 kg   Height: 5\' 8"  (1.727 m)     Pulse Oximetry Interpretation: Normal     Physical Exam   Constitutional: Patient is alert.  Well nourished.  NAD  Head: Atraumatic.   Eyes: EOMI. PERRL  ENT:  MMM.   Neck:  FROM. No spinal tenderness. Neck supple.    Cardiovascular: Normal rate and regular rhythm.   Pulmonary/Chest: Effort normal and breath sounds normal. No respiratory distress.   Abdominal: Soft. There is no tenderness. Bowel sounds present and normal.    Musculoskeletal:  No lower extremity edema or tenderness.    Neurological: Patient is alert and oriented to person, place, and time.  No focal deficits.   Skin: Left side of lower abd w/cluster of vesicular lesions and right upper back with same.     EKG:  Interpreted by the EP.   Time Interpreted: 1240 PM   Rate: 78   Rhythm: Normal Sinus Rhythm    Interpretation: Nonspecific ST changes.     Comparison:  Rate decreased compared to 2019    Old Medical Records: Nursing notes.    Patient Update Notes:       Provider Notes: Shingles.  Valacyclovir and pain meds.  F/U PMD.     Clinical Impression:   1. Herpes zoster without complication        ED Disposition       ED Disposition   Discharge    Condition   --    Date/Time   Mon Dec 07, 2020 12:49 PM    Comment   Wilburn Mylar discharge to home/self care.    Condition at disposition: Stable                     CHART OWNERSHIP: Dr. Ulice Bold is the primary emergency physician of record.    This note was generated by the Epic EMR system/ Dragon speech recognition and may contain inherent errors or omissions not intended by the user. Grammatical errors, random word insertions, deletions and pronoun errors  are occasional consequences of this technology due to software limitations. Not all errors are caught or corrected. If there are questions or concerns  about the content of this note or information contained within the body of this dictation they should be addressed directly with the author for clarification     Tenny Craw, MD  12/12/20 [phone]

## 2020-12-08 LAB — ECG 12-LEAD
Atrial Rate: 78 {beats}/min
IHS MUSE NARRATIVE AND IMPRESSION: NORMAL
P Axis: 65 degrees
P-R Interval: 134 ms
Q-T Interval: 328 ms
QRS Duration: 86 ms
QTC Calculation (Bezet): 373 ms
R Axis: 71 degrees
T Axis: 22 degrees
Ventricular Rate: 78 {beats}/min

## 2020-12-09 ENCOUNTER — Telehealth (INDEPENDENT_AMBULATORY_CARE_PROVIDER_SITE_OTHER): Payer: BLUE CROSS/BLUE SHIELD | Admitting: Internal Medicine

## 2020-12-09 ENCOUNTER — Encounter (INDEPENDENT_AMBULATORY_CARE_PROVIDER_SITE_OTHER): Payer: Self-pay | Admitting: Internal Medicine

## 2020-12-09 DIAGNOSIS — M549 Dorsalgia, unspecified: Secondary | ICD-10-CM | POA: Insufficient documentation

## 2020-12-09 DIAGNOSIS — G43519 Persistent migraine aura without cerebral infarction, intractable, without status migrainosus: Secondary | ICD-10-CM

## 2020-12-09 DIAGNOSIS — B0229 Other postherpetic nervous system involvement: Secondary | ICD-10-CM

## 2020-12-09 DIAGNOSIS — B029 Zoster without complications: Secondary | ICD-10-CM | POA: Insufficient documentation

## 2020-12-09 DIAGNOSIS — R21 Rash and other nonspecific skin eruption: Secondary | ICD-10-CM

## 2020-12-09 DIAGNOSIS — Z5181 Encounter for therapeutic drug level monitoring: Secondary | ICD-10-CM

## 2020-12-09 MED ORDER — RIZATRIPTAN BENZOATE 10 MG PO TABS
10.0000 mg | ORAL_TABLET | Freq: Once | ORAL | 5 refills | Status: DC | PRN
Start: 2020-12-09 — End: 2021-08-16

## 2020-12-09 MED ORDER — GABAPENTIN ENACARBIL ER 600 MG PO TBCR
600.0000 mg | EXTENDED_RELEASE_TABLET | ORAL | 2 refills | Status: DC
Start: 2020-12-09 — End: 2020-12-11

## 2020-12-09 NOTE — Progress Notes (Signed)
Subjective:      Date: 12/09/2020 10:52 PM   Patient ID: Thomas Carr is a 35 y.o. male.    Chief Complaint:  Chief Complaint   Patient presents with    ER Follow-up     On 12/07/20 for back pain.        HPI:  Thomas Carr is a 35 year old black male who was seen in the emergency room for acute back pain.  Work-up was negative, however a rash was noted and he was diagnosed with shingles.  He was prescribed an antiviral agent hydrocodone for treatment he follows up today in regards to his ER visit and diagnosis shingles he is experiencing stabbing pain in the left and right flanks.  He did not take the hydrocodone due to concerns of habitual use.  He was advised by a medical professional to ask for gabapentin.  He has a significant past medical history of migraines    Back Pain  This is a recurrent problem. The current episode started in the past 7 days. The pain is at a severity of 6/10.   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    Rash in adult    Postherpetic neuralgia    Medication changed to therapeutic equivalent on formulary    Herpes zoster without complication       Current Medications:  Current Outpatient Medications   Medication Sig Dispense Refill    Acetaminophen (TYLENOL PO) Take by mouth      diclofenac (VOLTAREN) 75 MG EC tablet Take 75 mg by mouth as needed      ibuprofen (ADVIL) 600 MG tablet Take 1 tablet (600 mg total) by mouth every 6 (six) hours as needed for Pain 30 tablet 0    loratadine (CLARITIN) 10 MG tablet Take 10 mg by mouth daily      gabapentin (NEURONTIN) 300 MG capsule Take 1 capsule (300 mg total) by mouth daily for 1 day, THEN 1 capsule (300 mg total) 2 (two) times daily for 1 day, THEN 1 capsule (300 mg total) 3 (three) times daily. 90 capsule  1    montelukast (SINGULAIR) 10 MG tablet Take 1 tablet (10 mg total) by mouth daily 90 tablet 3    Rimegepant Sulfate 75 MG Tablet Dispersible Take 1 tablet (75 mg total) by mouth as needed (migraine) 6 tablet 1    rizatriptan (Maxalt) 10 MG tablet Take 1 tablet (10 mg total) by mouth once as needed for Migraine Take at onset of migraine;  May repeat in 2 hrs - max dosing 20mg /24 hrs 9 tablet 5     No current facility-administered medications for this visit.       Allergies:  No Known Allergies    Past Medical History:  Past Medical History:   Diagnosis Date    Back pain     Migraines     Seasonal allergic rhinitis        Past Surgical History:  History reviewed. No pertinent surgical history.    Family History:  Family History   Problem Relation Age of Onset    Hypertension Mother     Diabetes Father     Other Father  Cardiac Arrest       Social History:  Social History     Socioeconomic History    Marital status: Single   Tobacco Use    Smoking status: Never    Smokeless tobacco: Never    Tobacco comments:     just tried " a long time ago"   Vaping Use    Vaping Use: Never used   Substance and Sexual Activity    Alcohol use: Yes     Comment: socailly    Drug use: No    Sexual activity: Yes     Partners: Female     Birth control/protection: Condom     Social Determinants of Psychologist, prison and probation services Strain: Low Risk     Difficulty of Paying Living Expenses: Not very hard   Food Insecurity: No Food Insecurity    Worried About Programme researcher, broadcasting/film/video in the Last Year: Never true    Ran Out of Food in the Last Year: Never true   Transportation Needs: No Transportation Needs    Lack of Transportation (Medical): No    Lack of Transportation (Non-Medical): No   Physical Activity: Unknown    Days of Exercise per Week: 5 days   Stress: Stress Concern Present    Feeling of Stress : Rather much   Social Connections: Moderately Integrated    Frequency of Communication with Friends and Family: More than three times a  week    Frequency of Social Gatherings with Friends and Family: Never    Attends Religious Services: More than 4 times per year    Active Member of [employer] or Organizations: Yes    Attends Engineer, structural: More than 4 times per year    Marital Status: Never married   Catering manager Violence: Not At Risk    Fear of Current or Ex-Partner: No    Emotionally Abused: No    Physically Abused: No    Sexually Abused: No   Housing Stability: Low Risk     Unable to Pay for Housing in the Last Year: No    Number of Places Lived in the Last Year: 1    Unstable Housing in the Last Year: No        The following sections were reviewed this encounter by the provider:   Tobacco  Allergies  Meds  Problems  Med Hx  Surg Hx  Fam Hx         ROS:  Review of Systems   Musculoskeletal:  Positive for back pain.      Objective:     Vitals:  There were no vitals taken for this visit.    Physical Exam:  Physical Exam  Vitals and nursing note reviewed.   Constitutional:       General: He is in acute distress.   HENT:      Head: Normocephalic and atraumatic.   Pulmonary:      Effort: Pulmonary effort is normal.   Musculoskeletal:         General: Tenderness present.   Skin:     Findings: Erythema, lesion and rash present.   Neurological:      General: No focal deficit present.      Mental Status: He is alert and oriented to person, place, and time. Mental status is at baseline.   Psychiatric:         Mood and Affect: Mood normal.         Behavior:  Behavior normal.         Thought Content: Thought content normal.         Judgment: Judgment normal.       Assessment/Plan:   35 year old male presenting with herpes zoster with postherpetic neuralgia patient was seen in the emergency room on 12/07/2020 with back pain and a rash.  Please refer to emergency room note on 12/07/2020 for further details.  Discharged with diagnosis of herpes zoster and prescribed valacyclovir and ibuprofen.  He is requesting alternative therapy for his  postherpetic neuralgia, and also requesting shingles vaccine, particularly Shingrix.  Advised that unable to prescribe Shingrix due to the fact that is often provided to individuals over the age of 52, and may not be covered by his insurance.  Alternative is Zostavax which is still available for individuals less than age 34.  1. Postherpetic neuralgia    2. Herpes zoster without complication    3. 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 Rash in adult    6. Medication changed to therapeutic equivalent on formulary    Return in about 4 weeks (around 01/06/2021), or if symptoms worsen or fail to improve.  Time spent in medical discussion: 21 to 30 minutes  Herbert Deaner, MD  Tenny Craw, MD    Physician   Emergency Medicine   ED Provider Notes      Signed   Date of Service:  12/07/2020 12:36 PM   Creation Time:  12/07/2020 12:36 PM              Signed        Expand All Collapse All  EMERGENCY DEPARTMENT HISTORY AND PHYSICAL EXAM     PPE: Mask, face shield and gloves were worn every time I entered the room.      History of Presenting Illness:  History Provided By: Patient     Thomas Carr is a 35 y.o. male pw back pain and rash.  Patient reports symptoms started approximately 4 days ago.  Seem to occur after seeing a chiropractor.  Reports pain in the left lower back and right upper back.  Pain radiates to the front.  He reports  hypersensitivity.  Rash started shortly after the pain.  Initially attributed to eczema.     Reviewed and confirmed past medical, surgical, family and social history as documented.     PCP: Herbert Deaner, MD  SPECIALISTS:     Review of Systems:  Review of Systems   Constitutional:  Negative for chills and fever.   Respiratory:  Negative for cough and shortness of breath.    Musculoskeletal:  Positive for back pain.   Skin:  Positive for rash.   All other systems reviewed as negative.     Physical Exam:  Vitals       Vitals:     12/07/20 1040   BP: 118/78   Pulse: 88   Resp: 18   Temp: 98.2 F (36.8 C)   TempSrc: Oral   SpO2: 100%   Weight: 75.3 kg   Height: 5\' 8"  (1.727 m)         Pulse Oximetry Interpretation: Normal      Physical Exam   Constitutional: Patient is alert.  Well nourished.  NAD  Head: Atraumatic.   Eyes: EOMI. PERRL  ENT:  MMM.   Neck:  FROM. No spinal tenderness. Neck supple.    Cardiovascular: Normal rate and regular rhythm.   Pulmonary/Chest: Effort normal and breath sounds normal. No respiratory distress.   Abdominal: Soft. There is no tenderness. Bowel sounds present and normal.    Musculoskeletal:  No lower extremity edema or tenderness.    Neurological: Patient is alert and oriented to person, place, and time.  No focal deficits.   Skin: Left side of lower abd w/cluster of vesicular lesions and right upper back with same.      EKG:  Interpreted by the EP.              Time Interpreted: 1240 PM              Rate: 78              Rhythm: Normal Sinus Rhythm               Interpretation: Nonspecific ST changes.               Comparison:  Rate decreased compared to 2019     Old Medical Records: Nursing notes.     Patient Update Notes:     Provider Notes: Shingles.  Valacyclovir and pain meds.  F/U PMD.      Clinical Impression:   1. Herpes zoster without complication          ED Disposition         ED Disposition   Discharge    Condition   --    Date/Time   Mon Dec 07, 2020 12:49 PM    Comment    Thomas Carr discharge to home/self care.     Condition at disposition: Stable  CHART OWNERSHIP: Dr. Ulice Bold is the primary emergency physician of record.     This note was generated by the Epic EMR system/ Dragon speech recognition and may contain inherent errors or omissions not intended by the user. Grammatical errors, random word insertions, deletions and pronoun errors  are occasional consequences of this technology due to software limitations. Not all errors are caught or corrected. If there are questions or concerns about the content of this note or information contained within the body of this dictation they should be addressed directly with the author for clarification     Tenny Craw, MD  12/12/20 [phone]               Electronically signed by Tenny Craw, MD at 12/12/2020  7:54 AM  ED on 12/07/2020    ED on 12/07/2020      Revision History      Detailed Report    Note shared with patient  Additional Orders and Documentation      Results        Meds        Orders        Flowsheets      Encounter Info:  History,  Allergies,  Detailed Report       Clinical Impressions      Herpes zoster without complication  Disposition      Discharge       ED After Visit Summary (Printed 12/07/2020)  Medication Changes      HYDROcodone-Acetaminophen 5-325 MG 1 tablet Oral Every 6 hours PRN    Ibuprofen 600 mg Oral Every 6 hours PRN    valACYclovir HCl 1,000 mg Oral 3 times daily     Medication List at Discharge     Care Timeline    1031   Arrived   1037   ECG 12 lead   1217   Urinalysis Reflex to Microscopic Exam   1253   Discharged

## 2020-12-09 NOTE — Progress Notes (Signed)
Have you seen any specialists/other providers since your last visit with us?    ENT    Arm preference verified?   Yes    The patient is due for nothing at this time, HM is up-to-date.

## 2020-12-10 ENCOUNTER — Telehealth (INDEPENDENT_AMBULATORY_CARE_PROVIDER_SITE_OTHER): Payer: Self-pay | Admitting: Internal Medicine

## 2020-12-10 NOTE — Progress Notes (Signed)
Gabapentin requires prior authorization.       [facility] [address] [place], [place] - [address]  [address] [place] [address]  Phone: [phone] Fax: [phone]

## 2020-12-10 NOTE — Telephone Encounter (Signed)
Name, strength, directions of requested refill(s):   gabapentin enacarbil ER (HORIZANT) 600 MG Tab CR    Pharmacy to send refill to or patient to pick up rx from office (mark requested pharmacy in BOLD):     [facility] [address] [place], [place] [address]      Please mark "X" next to the preferred call back number:    Mobile:   Telephone Information:   Mobile [phone]       Home: @HOMEPHONE @    Work: @WORKPHONE @        Medication refill request , see above. Thank you     Additional Notes: Pt called back to discuss a refill on meds.    Next Visit:

## 2020-12-11 ENCOUNTER — Telehealth (INDEPENDENT_AMBULATORY_CARE_PROVIDER_SITE_OTHER): Payer: Self-pay

## 2020-12-11 ENCOUNTER — Encounter (INDEPENDENT_AMBULATORY_CARE_PROVIDER_SITE_OTHER): Payer: Self-pay

## 2020-12-11 DIAGNOSIS — B0229 Other postherpetic nervous system involvement: Secondary | ICD-10-CM

## 2020-12-11 MED ORDER — GABAPENTIN 300 MG PO CAPS
ORAL_CAPSULE | ORAL | 1 refills | Status: DC
Start: 2020-12-11 — End: 2021-08-16

## 2020-12-11 NOTE — Telephone Encounter (Signed)
Requested Prescriptions     Signed Prescriptions Disp Refills    gabapentin (NEURONTIN) 300 MG capsule 90 capsule 1     Sig: Take 1 capsule (300 mg total) by mouth daily for 1 day, THEN 1 capsule (300 mg total) 2 (two) times daily for 1 day, THEN 1 capsule (300 mg total) 3 (three) times daily.     Authorizing Provider: Buelah Manis A     Gabapentin ER was discontinued.  Secondary to formulary status.  Have prescribed gabapentin 200 mg 3 times daily for next 30 to 60 days

## 2020-12-11 NOTE — Telephone Encounter (Signed)
Pt came into the office and informed me that his insurance will not approve the Gabapentin Enacarbil ER 600 mg tab.     Pt stated he spoke with the pharmacy and they told him that the provider would need to prescribe the generic instead of the brand in order for the prescription to get approved.     Pt requesting if you could resend a rx refill for the Generic for it to get approved.       Please advise.

## 2020-12-31 ENCOUNTER — Encounter (INDEPENDENT_AMBULATORY_CARE_PROVIDER_SITE_OTHER): Payer: Self-pay | Admitting: Internal Medicine

## 2021-05-07 ENCOUNTER — Encounter (INDEPENDENT_AMBULATORY_CARE_PROVIDER_SITE_OTHER): Payer: Self-pay | Admitting: Internal Medicine

## 2021-05-07 ENCOUNTER — Ambulatory Visit (INDEPENDENT_AMBULATORY_CARE_PROVIDER_SITE_OTHER): Payer: BLUE CROSS/BLUE SHIELD | Admitting: Internal Medicine

## 2021-05-07 VITALS — BP 113/74 | HR 88 | Temp 98.6°F | Resp 17 | Wt 171.8 lb

## 2021-05-07 DIAGNOSIS — R140 Abdominal distension (gaseous): Secondary | ICD-10-CM

## 2021-05-07 DIAGNOSIS — G43501 Persistent migraine aura without cerebral infarction, not intractable, with status migrainosus: Secondary | ICD-10-CM

## 2021-05-07 DIAGNOSIS — R194 Change in bowel habit: Secondary | ICD-10-CM

## 2021-05-07 DIAGNOSIS — R1013 Epigastric pain: Secondary | ICD-10-CM

## 2021-05-07 DIAGNOSIS — Z23 Encounter for immunization: Secondary | ICD-10-CM

## 2021-05-07 NOTE — Progress Notes (Unsigned)
Have you seen any specialists/other providers since your last visit with [place]?    No    Arm preference? NO    Health Maintenance Due   Topic Date Due    COVID-19 Vaccine (4 - Booster for Pfizer series) 06/18/2020    INFLUENZA VACCINE  01/04/2021

## 2021-05-07 NOTE — Progress Notes (Unsigned)
Subjective:      Date: 05/07/2021 3:04 PM   Patient ID: Thomas Carr is a 35 y.o. male.    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    Rash in adult    Postherpetic neuralgia    Medication changed to therapeutic equivalent on formulary    Herpes zoster without complication       Current Medications:  Current Outpatient Medications   Medication Sig Dispense Refill    Acetaminophen (TYLENOL PO) Take by mouth as needed      ibuprofen (ADVIL) 600 MG tablet Take 1 tablet (600 mg total) by mouth every 6 (six) hours as needed for Pain 30 tablet 0    loratadine (CLARITIN) 10 MG tablet Take 10 mg by mouth as needed      rizatriptan (Maxalt) 10 MG tablet Take 1 tablet (10 mg total) by mouth once as needed for Migraine Take at onset of migraine;  May repeat in 2 hrs - max dosing 20mg /24 hrs 9 tablet 5    diclofenac (VOLTAREN) 75 MG EC tablet Take 75 mg by mouth as needed (Patient not taking: Reported on 05/07/2021)      gabapentin (NEURONTIN) 300 MG capsule Take 1 capsule (300 mg total) by mouth daily for 1 day, THEN 1 capsule (300 mg total) 2 (two) times daily for 1 day, THEN 1 capsule (300 mg total) 3 (three) times daily. 90 capsule 1    montelukast (SINGULAIR) 10 MG tablet Take 1 tablet (10 mg total) by mouth daily 90 tablet 3    Rimegepant Sulfate 75 MG Tablet Dispersible Take 1 tablet (75 mg total) by mouth as needed (migraine) 6 tablet 1     No current facility-administered medications for this visit.       Allergies:  No Known Allergies    Past Medical History:  Past Medical History:   Diagnosis Date    Back pain     Migraines     Seasonal allergic rhinitis        Past Surgical History:  History reviewed. No pertinent  surgical history.    Family History:  Family History   Problem Relation Age of Onset    Hypertension Mother     Diabetes Father     Other Father         Cardiac Arrest       Social History:  Social History     Tobacco Use    Smoking status: Never    Smokeless tobacco: Never    Tobacco comments:     just tried " a long time ago"   Vaping Use    Vaping Use: Never used   Substance Use Topics    Alcohol use: Yes     Comment: socailly    Drug use: No         The following sections were reviewed this encounter by the provider:        ROS:  Review of Systems     Objective:     Vitals:  BP 113/74 (BP  Site: Left arm, Patient Position: Sitting, Cuff Size: Large)   Pulse 88   Temp 98.6 F (37 C) (Temporal)   Resp 17   Wt 77.9 kg (171 lb 12.8 oz)   SpO2 98%   BMI 26.12 kg/m     Physical Exam:  Physical Exam    Assessment/Plan:       1. Flu vaccine need  - Flu vaccine QUADRIVALENT (PF) 6 months and older (FLULAVAL/FLUARIX)      No follow-ups on file.    Herbert Deaner, MD

## 2021-05-07 NOTE — Progress Notes (Unsigned)
Subjective:      Date: 05/07/2021 2:58 PM   Patient ID: Thomas Carr is a 35 y.o. male.    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    Rash in adult    Postherpetic neuralgia    Medication changed to therapeutic equivalent on formulary    Herpes zoster without complication       Current Medications:  Current Outpatient Medications   Medication Sig Dispense Refill    Acetaminophen (TYLENOL PO) Take by mouth as needed      ibuprofen (ADVIL) 600 MG tablet Take 1 tablet (600 mg total) by mouth every 6 (six) hours as needed for Pain 30 tablet 0    loratadine (CLARITIN) 10 MG tablet Take 10 mg by mouth as needed      rizatriptan (Maxalt) 10 MG tablet Take 1 tablet (10 mg total) by mouth once as needed for Migraine Take at onset of migraine;  May repeat in 2 hrs - max dosing 20mg /24 hrs 9 tablet 5    diclofenac (VOLTAREN) 75 MG EC tablet Take 75 mg by mouth as needed (Patient not taking: Reported on 05/07/2021)      gabapentin (NEURONTIN) 300 MG capsule Take 1 capsule (300 mg total) by mouth daily for 1 day, THEN 1 capsule (300 mg total) 2 (two) times daily for 1 day, THEN 1 capsule (300 mg total) 3 (three) times daily. 90 capsule 1    montelukast (SINGULAIR) 10 MG tablet Take 1 tablet (10 mg total) by mouth daily 90 tablet 3    Rimegepant Sulfate 75 MG Tablet Dispersible Take 1 tablet (75 mg total) by mouth as needed (migraine) 6 tablet 1     No current facility-administered medications for this visit.       Allergies:  No Known Allergies    Past Medical History:  Past Medical History:   Diagnosis Date    Back pain     Migraines     Seasonal allergic rhinitis        Past Surgical History:  History reviewed. No pertinent  surgical history.    Family History:  Family History   Problem Relation Age of Onset    Hypertension Mother     Diabetes Father     Other Father         Cardiac Arrest       Social History:  Social History     Tobacco Use    Smoking status: Never    Smokeless tobacco: Never    Tobacco comments:     just tried " a long time ago"   Vaping Use    Vaping Use: Never used   Substance Use Topics    Alcohol use: Yes     Comment: socailly    Drug use: No         The following sections were reviewed this encounter by the provider:        ROS:  Review of Systems     Objective:     Vitals:  BP 113/74 (BP  Site: Left arm, Patient Position: Sitting, Cuff Size: Large)   Pulse 88   Temp 98.6 F (37 C) (Temporal)   Resp 17   Wt 77.9 kg (171 lb 12.8 oz)   SpO2 98%   BMI 26.12 kg/m     Physical Exam:  Physical Exam    Assessment/Plan:       1. Flu vaccine need  - Flu vaccine QUADRIVALENT (PF) 6 months and older (FLULAVAL/FLUARIX)      No follow-ups on file.    Herbert Deaner, MD

## 2021-05-07 NOTE — Progress Notes (Unsigned)
Patient presented to the office for Flu Vaccine administration.  Received injection in the Left arm.  No reaction was noted and patient left in good condition.

## 2021-05-24 ENCOUNTER — Encounter (INDEPENDENT_AMBULATORY_CARE_PROVIDER_SITE_OTHER): Payer: Self-pay | Admitting: Internal Medicine

## 2021-08-16 ENCOUNTER — Encounter: Payer: Self-pay | Admitting: No Specialty

## 2021-08-16 ENCOUNTER — Ambulatory Visit: Payer: BLUE CROSS/BLUE SHIELD | Attending: No Specialty | Admitting: No Specialty

## 2021-08-16 ENCOUNTER — Telehealth: Payer: Self-pay

## 2021-08-16 VITALS — BP 128/79 | HR 86 | Temp 98.1°F | Ht 68.0 in | Wt 177.4 lb

## 2021-08-16 DIAGNOSIS — G43709 Chronic migraine without aura, not intractable, without status migrainosus: Secondary | ICD-10-CM | POA: Insufficient documentation

## 2021-08-16 MED ORDER — NORTRIPTYLINE HCL 10 MG PO CAPS
ORAL_CAPSULE | ORAL | 2 refills | Status: DC
Start: 2021-08-16 — End: 2021-09-09

## 2021-08-16 MED ORDER — RIMEGEPANT SULFATE 75 MG PO TBDP
75.0000 mg | ORAL_TABLET | Freq: Every day | ORAL | 2 refills | Status: DC | PRN
Start: 2021-08-16 — End: 2021-11-22

## 2021-08-16 NOTE — Telephone Encounter (Signed)
Prior Auth for Nurtec ODT 75mg     PA Outcome APPROVED    Case ID [id number]    Effective 07/17/2021 - 02/12/2022    $9811.91 coinsurance for #16/30-day supply, reduced to $0 with [facility] secondary

## 2021-08-16 NOTE — Telephone Encounter (Signed)
Received request via [employer] and beginning benefits investigation for Nurtec ODT 75mg      Take 1 tablet by mouth daily as needed     PA required. Initiated via CMM (Key: [id number])

## 2021-08-16 NOTE — Progress Notes (Signed)
08/16/2021      CC: headcahes    History was obtained from patient   History of Present Illness:  Thomas Carr is a 36 y.o. male who p/w migraines.     Headache hx: since youth, worsenign since 2019  Location:bi frontal and bi-tmeporal  Described as pressure like  Occurs:2-3 times per week  Duration:>4 hours to full day  Intensity: moderate to severe  Aura:none  Associated with photo/phonophobia and nausea  Denies focal motor/sensory or visual changes.   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 No pertinent surgical history.    Allergies:  Patient has no known allergies.    Family History:  Family History   Problem Relation Age of Onset    Hypertension Mother     Diabetes Father     Other Father         Cardiac Arrest     No other family history related to current complaints.    Social History:  Social History     Tobacco Use    Smoking status: Never    Smokeless tobacco: Never    Tobacco comments:     just tried " a long time ago"   Vaping Use    Vaping Use: Never used   Substance Use Topics    Alcohol use: Yes     Comment: socailly 5 drinks in a month    Drug use: No       Medications:  Current Outpatient Medications   Medication Sig Dispense Refill    Acetaminophen (TYLENOL PO) Take by mouth as needed      loratadine (CLARITIN) 10 MG tablet Take 10 mg by mouth as needed      diclofenac (VOLTAREN) 75 MG EC tablet Take 75 mg by mouth as needed (Patient not taking: Reported on  05/07/2021)      gabapentin (NEURONTIN) 300 MG capsule Take 1 capsule (300 mg total) by mouth daily for 1 day, THEN 1 capsule (300 mg total) 2 (two) times daily for 1 day, THEN 1 capsule (300 mg total) 3 (three) times daily. 90 capsule 1    ibuprofen (ADVIL) 600 MG tablet Take 1 tablet (600 mg total) by mouth every 6 (six) hours as needed for Pain 30 tablet 0    montelukast (SINGULAIR) 10 MG tablet Take 1 tablet (10 mg total) by mouth daily 90 tablet 3    Rimegepant Sulfate 75 MG Tablet Dispersible Take 1 tablet (75 mg total) by mouth as needed (migraine) 6 tablet 1    rizatriptan (Maxalt) 10 MG tablet Take 1 tablet (10 mg total) by mouth once as needed for Migraine Take at onset of migraine;  May repeat in 2 hrs - max dosing 20mg /24 hrs 9 tablet 5     No current facility-administered medications for this visit.       General  Exam:  BP 128/79 (BP Site: Left arm)   Pulse 86   Temp 98.1 F (36.7 C) (Oral)   Ht 1.727 m (5\' 8" )   Wt 80.5 kg (177 lb 6.4 oz)   BMI 26.97 kg/m   Respiratory rate wnl.   Gen:  Well-developed, well-nourished.  No acute distress.  Cooperative with exam.  HEENT:  NC/AT. No icterus or conjunctival hemorrhage noted.  Neck: Full range of motion.    Lungs: No audible wheezing or dyspnea at rest.   Skin:  No obvious rashes or lesions.   Extrem:  No obvious edema noted in hands.     NEUROLOGICAL EXAM    Mental Status: Awake, alert, oriented. Speech fluent without dysarthria. Follows commands well. Attention, memory, and concentration intact. Fund of knowledge appropriate for education.     Cranial Nerves: Pupils equally round. Extraocular movements are full. No nystagmus seen. Facial sensation to light touch appears intact. Face is symmetric. Hearing is intact to conversational speech. Cranial nerves II-XII otherwise appears intact.     Motor: Moves all extremities well    Dtr: 2 throughout, toes down    Coordination: No truncal ataxia.    Gait: Normal and steady.       Investigations:  Labs:  Gfr>60, normal    Imaging:  I have personally reviewed the images and agree with the radiologist interpretation as below:   MRI is normal      Recommendations:  1. Chronic migraine without aura without status migrainosus, not intractable    Thomas Carr is a 36 y.o. male who presents for chronic migraine.     Migraine preventive: nortriptyline    Nurtec as needed at migraine onset    Follow the SEEDS for success in headache management, including Sleep hygiene, Exercising regularly, Eating healthy and regular meals, Drinking water, keeping a headache Diary, and Stress reduction.  Limit acute medication use to no more than 3 days per week      Referring (see communications section)      The patient should seek emergent care if there is any change in the symptoms. Proper use and all side effects of medications discussed    Discussed the importance of sleep hygiene, maintaining appropriate hydration, avoid overuse of caffeine and OTC medications for headache lifestyle modification      Please contact me with any questions. Patients and [place] Providers can reach me via MyChart.    30, MD  [facility]  [place]: [phone]  [phone]: [phone]    *DISCLAIMER: This note was generated by the Epic EMR system/ Dragon speech recognition and may contain inherent errors or omissions not intended by the user. Grammatical errors, random word insertions, deletions, pronoun errors and incomplete sentences are occasional consequences of this technology due to software limitations. Not all errors are caught or corrected. If there are questions or concerns about the content of this note or information contained within the body of this dictation they should be addressed directly with the author for clarification.*

## 2021-08-16 NOTE — Patient Instructions (Addendum)
Our plan:     Migraine preventive: nortriptyline    Nurtec as needed at migraine onset    Follow the SEEDS for success in headache management, including Sleep hygiene, Exercising regularly, Eating healthy and regular meals, Drinking water, keeping a headache Diary, and Stress reduction.  Limit acute medication use to no more than 3 days per week          Today's Visit:      In today's visit, I reviewed your medications and records relating your health - prior testing, blood work, reports of other health care providers present in your electronic medical record.     If you have pertinent records from any non-[place] doctors that you would like to review, please have them sent to [place] or bring to them to your next office visit.     A copy of today's visit will be sent to your referring doctor and/or primary care doctor, if you have one listed in our system.    Let me know if there are things we could have done better for your office visit.    Patient satisfaction survey:      If you receive a patient satisfaction survey, I would greatly appreciate it if you would complete it. We value your feedback.     Contact me online:      Patient Portal online - Please sign up for MyChart -- this is the best way to communicate with your team here.  There is a messaging feature, where you can [place] messages anytime of day.  It is the best way to communicate with [place] and get test results, medication refills, or ask questions.     You can expect to get a response within 24-48 hours during weekdays.  If you do not receive a timely response, resend your request and inform us. My goal is for every question answered ever day.  Average response for a phone call maybe 3 days due to the volume we receive (which is why MyChart is preferred).    If you are having a medical emergency -- call 911, DO NOT SEND A MESSAGE THROUGH MYCHART.     Coupons for medication:      If you have any trouble affording your medications, check out www.goodrx.com for  coupons and competitive prices in your area.  If you need further assistance, let us know so we can work with you and your insurance to make sure you get the best care.    Thank you for trusting me with your health.      Take care,  Fraser Din, MD   [facility]   [facility]: [phone]  [place]: [phone]

## 2021-09-09 ENCOUNTER — Other Ambulatory Visit: Payer: Self-pay | Admitting: No Specialty

## 2021-11-22 ENCOUNTER — Ambulatory Visit: Payer: BLUE CROSS/BLUE SHIELD | Attending: No Specialty | Admitting: No Specialty

## 2021-11-22 ENCOUNTER — Encounter: Payer: Self-pay | Admitting: No Specialty

## 2021-11-22 VITALS — BP 129/84 | HR 96 | Temp 97.9°F | Resp 14 | Ht 68.0 in | Wt 173.0 lb

## 2021-11-22 DIAGNOSIS — M542 Cervicalgia: Secondary | ICD-10-CM | POA: Insufficient documentation

## 2021-11-22 DIAGNOSIS — G43009 Migraine without aura, not intractable, without status migrainosus: Secondary | ICD-10-CM

## 2021-11-22 MED ORDER — RIMEGEPANT SULFATE 75 MG PO TBDP
75.0000 mg | ORAL_TABLET | Freq: Every day | ORAL | 5 refills | Status: DC | PRN
Start: 2021-11-22 — End: 2022-05-23

## 2021-11-22 NOTE — Patient Instructions (Signed)
Our plan:     Continue current medications        Today's Visit:      In today's visit, I reviewed your medications and records relating your health - prior testing, blood work, reports of other health care providers present in your electronic medical record.     If you have pertinent records from any non-[place] doctors that you would like to review, please have them sent to us or bring to them to your next office visit.     A copy of today's visit will be sent to your referring doctor and/or primary care doctor, if you have one listed in our system.    Let me know if there are things we could have done better for your office visit.    Patient satisfaction survey:      If you receive a patient satisfaction survey, I would greatly appreciate it if you would complete it. We value your feedback.     Contact me online:      Patient Portal online - Please sign up for MyChart -- this is the best way to communicate with your team here.  There is a messaging feature, where you can us messages anytime of day.  It is the best way to communicate with us and get test results, medication refills, or ask questions.     You can expect to get a response within 24-48 hours during weekdays.  If you do not receive a timely response, resend your request and inform us. My goal is for every question answered ever day.  Average response for a phone call maybe 3 days due to the volume we receive (which is why MyChart is preferred).    If you are having a medical emergency -- call 911, DO NOT SEND A MESSAGE THROUGH MYCHART.     Coupons for medication:      If you have any trouble affording your medications, check out www.goodrx.com for coupons and competitive prices in your area.  If you need further assistance, let us know so we can work with you and your insurance to make sure you get the best care.    Thank you for trusting me with your health.      Take care,  Jago Carton, MD  [facility]   [facility]: [phone]  [place]: [phone]

## 2021-11-22 NOTE — Progress Notes (Signed)
11/22/2021      CC: headcahes    History was obtained from patient   History of Present Illness:  Thomas Carr is a 36 y.o. male who p/w migraines.     June 2023:   He feels the nortriptyline is effective. He is down to one headcahe per week.   He feels the Nurtec is effective when he takes it early in the headache.     He has had problems with Msk neck pain. It is stiffness more than anything. No persistent paresthesias or weakness. No B/B changes.       March 2023  Headache hx: since youth, worsenign since 2019  Location:bi frontal and bi-tmeporal  Described as pressure like  Occurs:2-3 times per week  Duration:>4 hours to full day  Intensity: moderate to severe  Aura:none  Associated with photo/phonophobia and nausea  Denies focal motor/sensory or visual changes.   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 No pertinent surgical history.    Allergies:  Patient has no known allergies.    Family History:  Family History   Problem Relation Age of Onset    Hypertension Mother     Diabetes Father     Other Father         Cardiac Arrest     No other family history related to current complaints.    Social History:  Social History     Tobacco Use    Smoking status: Never    Smokeless tobacco: Never    Tobacco comments:     just tried " a long time ago"   Vaping Use    Vaping status: Never Used   Substance Use Topics    Alcohol use: Yes     Comment: socailly 5 drinks in a month    Drug  use: No       Medications:  Current Outpatient Medications   Medication Sig Dispense Refill    Acetaminophen (TYLENOL PO) Take by mouth as needed      nortriptyline (PAMELOR) 10 MG capsule TAKE 1 TAB BY MOUTH AT BEDTIME X 1WEEK. THEN INCREASE TO 2 TABS BY MOUTH AT BEDTIME 180 capsule 2    Rimegepant Sulfate 75 MG Tablet Dispersible Take 1 tablet (75 mg) by mouth daily as needed (Migraine) 16 tablet 2    loratadine (CLARITIN) 10 MG tablet Take 10 mg by mouth as needed (Patient not taking: Reported on 11/22/2021)       No current facility-administered medications for this visit.       General Exam:  BP 129/84   Pulse 96   Temp 97.9 F (36.6 C)   Resp 14   Ht 1.727 m (5\' 8" )   Wt 78.5 kg (173 lb)   BMI 26.30 kg/m   Respiratory rate wnl.   Gen:  Well-developed, well-nourished.  No acute distress.  Cooperative with exam.  HEENT:  NC/AT. No icterus or conjunctival hemorrhage noted.  Neck: Full range of motion.    Lungs: No audible wheezing or dyspnea at rest.   Skin:  No obvious rashes or lesions.   Extrem:  No obvious edema noted in hands.     NEUROLOGICAL EXAM    Mental Status: Awake, alert, oriented. Speech fluent without dysarthria. Follows commands well. Attention, memory, and concentration intact. Fund of knowledge appropriate for education.     Cranial Nerves: Pupils equally round. Extraocular movements are full. No nystagmus seen. Facial sensation to light touch appears intact. Face is symmetric. Hearing is intact to conversational speech. Cranial nerves II-XII otherwise appears intact.     Motor: Moves all extremities well    Coordination: No truncal ataxia.    Gait: Normal and steady.      Investigations:  Labs:  Gfr>60, normal    Imaging:  04/2021  [facility] MRI is normal      Recommendations:  1. Migraine without aura and without status migrainosus, not intractable    2. Neck pain      Thomas Carr is a 36 y.o. male who presents for chronic migraine and MSK neck pain.   Brain MRI is normal    Continue  nortriptyline 20mg  qhs    Nurtec as needed at migraine onset    We discussed PT referral for MSK neck pain. He declined for now.           Referring (see communications section)      The patient should seek emergent care if there is any change in the symptoms. Proper use and all side effects of medications discussed    Discussed the importance of sleep hygiene, maintaining appropriate hydration, avoid overuse of caffeine and OTC medications for headache lifestyle modification      Please contact me with any questions. Patients and [place] Providers can reach me via MyChart.    , MD  [facility]  [place]: [phone]  [phone]: [phone]    *DISCLAIMER: This note was generated by the Epic EMR system/ Dragon speech recognition and may contain inherent errors or omissions not intended by the user. Grammatical errors, random word insertions, deletions, pronoun errors and incomplete sentences are occasional consequences of this technology due to software limitations. Not all errors are caught or corrected. If there are questions or concerns about the content of this note or information contained within the body of this dictation they should be addressed directly with the author for clarification.*

## 2021-12-11 ENCOUNTER — Encounter (INDEPENDENT_AMBULATORY_CARE_PROVIDER_SITE_OTHER): Payer: Self-pay

## 2022-04-27 ENCOUNTER — Ambulatory Visit (INDEPENDENT_AMBULATORY_CARE_PROVIDER_SITE_OTHER): Payer: BLUE CROSS/BLUE SHIELD | Admitting: Family

## 2022-04-27 ENCOUNTER — Encounter (INDEPENDENT_AMBULATORY_CARE_PROVIDER_SITE_OTHER): Payer: Self-pay

## 2022-04-27 VITALS — BP 122/85 | HR 77 | Temp 98.1°F | Resp 21 | Ht 68.0 in | Wt 175.0 lb

## 2022-04-27 DIAGNOSIS — M545 Low back pain, unspecified: Secondary | ICD-10-CM

## 2022-04-27 DIAGNOSIS — M5412 Radiculopathy, cervical region: Secondary | ICD-10-CM

## 2022-04-27 MED ORDER — METHYLPREDNISOLONE 4 MG PO TBPK
ORAL_TABLET | ORAL | 0 refills | Status: AC
Start: 2022-04-27 — End: 2022-05-03

## 2022-04-27 MED ORDER — CYCLOBENZAPRINE HCL 10 MG PO TABS
10.0000 mg | ORAL_TABLET | Freq: Three times a day (TID) | ORAL | 0 refills | Status: AC | PRN
Start: 2022-04-27 — End: 2022-05-02

## 2022-04-27 NOTE — Progress Notes (Signed)
Urgent Care Provider Note      Patient: Thomas Carr   Date: 04/27/2022   MRN: [id number]       Subjective     Chief Complaint   Patient presents with    Back Pain     About a week ago patient hurt his back on the right side and now pain has spreaded to his arms and fingertips where now he can feel pain       HPI:  HPI    Thomas Carr is a 36 y.o. male who presents with back pain that started 5-7 days ago pain is in lower right back muscles mild and moderate which started 1 week ago and has been unchanged. Mechanism of injury: believes it was after lifting weights and performing back rows.  Pt also began experiencing tingling in right arm and hand denies weakness reports symptom are improving slightly  . Patient denies extremity weakness, urinary incontinence, fecal incontinence, chest pain, and abdominal pain.    Pertinent Past Medical, Surgical, Family and Social History were reviewed.      Current Outpatient Medications:     Acetaminophen (TYLENOL PO), Take by mouth as needed, Disp: , Rfl:     loratadine (CLARITIN) 10 MG tablet, Take 10 mg by mouth as needed (Patient not taking: Reported on 11/22/2021), Disp: , Rfl:     nortriptyline (PAMELOR) 10 MG capsule, TAKE 1 TAB BY MOUTH AT BEDTIME X 1WEEK. THEN INCREASE TO 2 TABS BY MOUTH AT BEDTIME, Disp: 180 capsule, Rfl: 2    Rimegepant Sulfate 75 MG Tablet Dispersible, Take 1 tablet (75 mg) by mouth daily as needed (Migraine), Disp: 16 tablet, Rfl: 5    No Known Allergies    Medications and Allergies reviewed.         Objective     Vitals:    04/27/22 1553   BP: 122/85   Pulse: 77   Resp: 21   Temp: 98.1 F (36.7 C)   SpO2: 99%       Physical Exam    General: well developed, well nourished    Neck: supple, full range of motion    Heart: regular rate and rhythm    Lungs: No respiratory distress    Abdomen: soft    Back: no rash, external trauma, midline spinal tenderness,  and R. lumbar paraspinal tenderness, no rash or external trauma    Neurologic: Alert,  normal speech and cognition, normal strength and sensation in all extremities, normal gait and symmetric patellar/achilles DTR's    UCC COURSE  There were no labs reviewed with this patient during the visit.    There were no x-rays reviewed with this patient during the visit.    No current facility-administered medications for this visit.             PROCEDURES:  Procedures    MDM:  Pt is an otherwise healthy 36 year old male with hx of back pain and right arm tingling for 5-7 days pt reports symptoms began after performing back rows.    Medrol   Flexeril       Assessment      There are no diagnoses linked to this encounter.    Plan and follow-up discussed with patient. See AVS for additional documentation.

## 2022-05-23 ENCOUNTER — Ambulatory Visit: Payer: BLUE CROSS/BLUE SHIELD | Attending: No Specialty | Admitting: No Specialty

## 2022-05-23 ENCOUNTER — Encounter: Payer: Self-pay | Admitting: No Specialty

## 2022-05-23 VITALS — BP 129/78 | HR 82 | Ht 68.0 in | Wt 177.0 lb

## 2022-05-23 DIAGNOSIS — M542 Cervicalgia: Secondary | ICD-10-CM | POA: Insufficient documentation

## 2022-05-23 DIAGNOSIS — G43009 Migraine without aura, not intractable, without status migrainosus: Secondary | ICD-10-CM | POA: Insufficient documentation

## 2022-05-23 MED ORDER — NORTRIPTYLINE HCL 25 MG PO CAPS
ORAL_CAPSULE | ORAL | 3 refills | Status: DC
Start: 2022-05-23 — End: 2022-07-04

## 2022-05-23 MED ORDER — RIMEGEPANT SULFATE 75 MG PO TBDP
75.0000 mg | ORAL_TABLET | Freq: Every day | ORAL | 11 refills | Status: DC | PRN
Start: 2022-05-23 — End: 2023-05-15

## 2022-05-23 NOTE — Patient Instructions (Addendum)
Our plan:     Nurtec as needed for migraine    Nortriptyline 25 mg nightly    Follow the SEEDS for success in headache management, including Sleep hygiene, Exercising regularly, Eating healthy and regular meals, Drinking water, keeping a headache Diary, and Stress reduction.  Limit acute medication use to no more than 3 days per week         Today's Visit:      In today's visit, I reviewed your medications and records relating your health - prior testing, blood work, reports of other health care providers present in your electronic medical record.     If you have pertinent records from any non-[place] doctors that you would like to review, please have them sent to [place] or bring to them to your next office visit.     A copy of today's visit will be sent to your referring doctor and/or primary care doctor, if you have one listed in our system.    Let me know if there are things we could have done better for your office visit.    Patient satisfaction survey:      If you receive a patient satisfaction survey, I would greatly appreciate it if you would complete it. We value your feedback.     Contact me online:      Patient Portal online - Please sign up for MyChart -- this is the best way to communicate with your team here.  There is a messaging feature, where you can [place] messages anytime of day.  It is the best way to communicate with [place] and get test results, medication refills, or ask questions.     You can expect to get a response within 24-48 hours during weekdays.  If you do not receive a timely response, resend your request and inform us. My goal is for every question answered ever day.  Average response for a phone call maybe 3 days due to the volume we receive (which is why MyChart is preferred).    If you are having a medical emergency -- call 911, DO NOT SEND A MESSAGE THROUGH MYCHART.     Coupons for medication:      If you have any trouble affording your medications, check out www.goodrx.com for coupons and  competitive prices in your area.  If you need further assistance, let us know so we can work with you and your insurance to make sure you get the best care.    Thank you for trusting me with your health.      Take care,  Fraser Din, MD  [facility]   [facility]: [phone]  [place]: [phone]

## 2022-05-23 NOTE — Progress Notes (Signed)
05/23/2022      CC: headcahes    History was obtained from patient   History of Present Illness:  Thomas Carr is a 36 y.o. male who p/w migraines.     Dec 2023:   He presents for f/up.   He is doing in regards to his migraines. He does have some difficulty remembering to take the nortriptyline due to recent increase in his travel schedule.   Nurtec is effective and well tolerated. He reports 8 or less migraines monthly.       June 2023:   He feels the nortriptyline is effective. He is down to one headcahe per week.   He feels the Nurtec is effective when he takes it early in the headache.     He has had problems with Msk neck pain. It is stiffness more than anything. No persistent paresthesias or weakness. No B/B changes.       March 2023  Headache hx: since youth, worsenign since 2019  Location:bi frontal and bi-tmeporal  Described as pressure like  Occurs:2-3 times per week  Duration:>4 hours to full day  Intensity: moderate to severe  Aura:none  Associated with photo/phonophobia and nausea  Denies focal motor/sensory or visual changes.   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 No pertinent surgical history.    Allergies:  Patient has no known allergies.    Family History:  Family History   Problem Relation Age of Onset    Hypertension Mother     Diabetes Father     Other Father         Cardiac Arrest     No other family history related to current  complaints.    Social History:  Social History     Tobacco Use    Smoking status: Never    Smokeless tobacco: Never    Tobacco comments:     just tried " a long time ago"   Vaping Use    Vaping Use: Never used   Substance Use Topics    Alcohol use: Yes     Comment: socailly 5 drinks in a month    Drug use: No       Medications:  Current Outpatient Medications   Medication Sig Dispense Refill    Acetaminophen (TYLENOL PO) Take by mouth as needed      loratadine (CLARITIN) 10 MG tablet Take 10 mg by mouth as needed (Patient not taking: Reported on 11/22/2021)      nortriptyline (PAMELOR) 10 MG capsule TAKE 1 TAB BY MOUTH AT BEDTIME X 1WEEK. THEN INCREASE TO 2 TABS BY MOUTH AT BEDTIME 180 capsule 2    Rimegepant Sulfate 75 MG Tablet Dispersible Take 1 tablet (75 mg) by mouth daily as needed (Migraine) 16 tablet 5     No current facility-administered medications for this visit.  General Exam:  BP 129/78   Pulse 82   Ht 1.727 m (5\' 8" )   Wt 80.3 kg (177 lb)   SpO2 97%   BMI 26.91 kg/m   Respiratory rate wnl.   Gen:  Well-developed, well-nourished.  No acute distress.  Cooperative with exam.  HEENT:  NC/AT. No icterus or conjunctival hemorrhage noted.  Neck: Full range of motion.    Lungs: No audible wheezing or dyspnea at rest.   Skin:  No obvious rashes or lesions.   Extrem:  No obvious edema noted in hands.     NEUROLOGICAL EXAM    Mental Status: Awake, alert, oriented. Speech fluent without dysarthria. Follows commands well. Attention, memory, and concentration intact. Fund of knowledge appropriate for education.     Cranial Nerves: Extraocular movements are full. No nystagmus seen. Facial sensation to light touch appears intact. Face is symmetric. Hearing is intact to conversational speech. Cranial nerves II-XII otherwise appears intact.     Motor: Moves all extremities well    Coordination: No truncal ataxia.    Gait: Normal and steady.      Investigations:  Labs:  Gfr>60,  normal    Imaging:  04/2021  [facility] MRI is normal      Recommendations:  1. Migraine without aura and without status migrainosus, not intractable    2. Neck pain    Thomas Carr is a 36 y.o. male who presents for chronic migraine and MSK neck pain.   Brain MRI is normal  Plan:  Increase nortriptyline to 50mg  qhs  Nurtec as needed at migraine onset  F/up in 1 year          Referring (see communications section)      The patient should seek emergent care if there is any change in the symptoms. Proper use and all side effects of medications discussed    Discussed the importance of sleep hygiene, maintaining appropriate hydration, avoid overuse of caffeine and OTC medications for headache lifestyle modification      Please contact me with any questions. Patients and [place] Providers can reach me via MyChart.    Fraser Din, MD  [facility]  [place]: [phone]  [place] Pai: [phone]    *DISCLAIMER: This note was generated by the Epic EMR system/ Dragon speech recognition and may contain inherent errors or omissions not intended by the user. Grammatical errors, random word insertions, deletions, pronoun errors and incomplete sentences are occasional consequences of this technology due to software limitations. Not all errors are caught or corrected. If there are questions or concerns about the content of this note or information contained within the body of this dictation they should be addressed directly with the author for clarification.*

## 2022-07-01 ENCOUNTER — Other Ambulatory Visit: Payer: Self-pay | Admitting: No Specialty

## 2022-07-27 ENCOUNTER — Telehealth: Payer: Self-pay | Admitting: No Specialty

## 2022-07-27 NOTE — Telephone Encounter (Signed)
LVM/MYCHART to rsch

## 2023-02-16 ENCOUNTER — Telehealth: Payer: Self-pay

## 2023-02-16 NOTE — Telephone Encounter (Signed)
PA Renewal for Nurtec 75mg  ODT    Patient has Insurance BC?BS FEP    PA Required. Initiated via CMM (Key [id number]). Submitted PA with appt notes. Pending determination.     PA Status APPROVED     Case ID [id number]    Effective Dates 01/17/2023 through 02/16/2024    Approved Qty/Day supply 16/30

## 2023-05-15 ENCOUNTER — Ambulatory Visit: Payer: BLUE CROSS/BLUE SHIELD | Attending: Neurology | Admitting: No Specialty

## 2023-05-15 ENCOUNTER — Encounter: Payer: Self-pay | Admitting: No Specialty

## 2023-05-15 VITALS — BP 129/80 | HR 78 | Temp 99.1°F | Resp 16 | Ht 68.0 in | Wt 174.0 lb

## 2023-05-15 DIAGNOSIS — Z79899 Other long term (current) drug therapy: Secondary | ICD-10-CM

## 2023-05-15 DIAGNOSIS — G43009 Migraine without aura, not intractable, without status migrainosus: Secondary | ICD-10-CM

## 2023-05-15 MED ORDER — RIMEGEPANT SULFATE 75 MG PO TBDP
75.0000 mg | ORAL_TABLET | Freq: Every day | ORAL | 1 refills | Status: DC | PRN
Start: 2023-05-15 — End: 2023-10-02

## 2023-05-15 MED ORDER — NORTRIPTYLINE HCL 25 MG PO CAPS
ORAL_CAPSULE | ORAL | 1 refills | Status: DC
Start: 2023-05-15 — End: 2023-10-02

## 2023-05-15 NOTE — Patient Instructions (Addendum)
Our plan:     F/up with me mid-March    Continue nortriptyline    Continue Nurtec    May consider trial of Emgality if headaches remain frequent and sinuses are clear.       Today's Visit:      In today's visit, I reviewed your medications and records relating your health - prior testing, blood work, reports of other health care providers present in your electronic medical record.     If you have pertinent records from any non-[place] doctors that you would like to review, please have them sent to [place] or bring to them to your next office visit.     A copy of today's visit will be sent to your referring doctor and/or primary care doctor, if you have one listed in our system.    Let me know if there are things we could have done better for your office visit.    Patient satisfaction survey:      If you receive a patient satisfaction survey, I would greatly appreciate it if you would complete it. We value your feedback.     Contact me online:      Patient Portal online - Please sign up for MyChart -- this is the best way to communicate with your team here.  There is a messaging feature, where you can [place] messages anytime of day.  It is the best way to communicate with [place] and get test results, medication refills, or ask questions.     You can expect to get a response within 24-48 hours during weekdays.  If you do not receive a timely response, resend your request and inform us. My goal is for every question answered ever day.  Average response for a phone call maybe 3 days due to the volume we receive (which is why MyChart is preferred).    If you are having a medical emergency -- call 911, DO NOT SEND A MESSAGE THROUGH MYCHART.     Coupons for medication:      If you have any trouble affording your medications, check out www.goodrx.com for coupons and competitive prices in your area.  If you need further assistance, let us know so we can work with you and your insurance to make sure you get the best care.    Thank you for  trusting me with your health.      Take care,  Fraser Din, MD  [facility]   ICPH: [phone]  [place]: [phone]

## 2023-05-15 NOTE — Progress Notes (Signed)
05/15/2023      CC: headcahes    History was obtained from patient   History of Present Illness:  Thomas Carr is a 37 y.o. male who p/w migraines.     Dec 2024:   He  has more pressure like headaches with the weather change. Wonders if allergies playing a role. He is plannng to see ENT.   Nurtec is modestly effective.   Reports 8 or more headaches in the last month, most severe occurring 1-2 times weekly.       Dec 2023:   He presents for f/up.   He is doing in regards to his migraines. He does have some difficulty remembering to take the nortriptyline due to recent increase in his travel schedule.   Nurtec is effective and well tolerated. He reports 8 or less migraines monthly.       June 2023:   He feels the nortriptyline is effective. He is down to one headcahe per week.   He feels the Nurtec is effective when he takes it early in the headache.     He has had problems with Msk neck pain. It is stiffness more than anything. No persistent paresthesias or weakness. No B/B changes.       March 2023  Headache hx: since youth, worsenign since 2019  Location:bi frontal and bi-tmeporal  Described as pressure like  Occurs:2-3 times per week  Duration:>4 hours to full day  Intensity: moderate to severe  Aura:none  Associated with photo/phonophobia and nausea  Denies focal motor/sensory or visual changes.   No positional or autonomic symptoms  Exacerbating factors:unknown  Alleviating factors:rest in dark room  Sleep:OK, had mild positional OSA but was told he does not need CPAP definitively  Mood:OK    Previous Preventive Medications:  Gabapentin: ineffective  Nortriptyline: efefctive    Previous Abortive Medications:  Sumatriptan:not efefctive  Rizatriptan: not effective  Nurtec: on currently    HIT6:59     Records Reviewed:  Pcm notes for migraines      Past Medical History:  Past Medical History:   Diagnosis Date    Back pain     Migraines     Seasonal allergic rhinitis        Past Surgical History:  History  reviewed. No pertinent surgical history.    Allergies:  Patient has no known allergies.    Family History:  Family History   Problem Relation Age of Onset    Hypertension Mother     Diabetes Father     Other Father         Cardiac Arrest     No other family history related to current complaints.    Social History:  Social History     Tobacco Use    Smoking status: Never    Smokeless tobacco: Never    Tobacco comments:     just tried " a long time ago"   Vaping Use    Vaping status: Never Used   Substance Use Topics    Alcohol use: Yes     Alcohol/week: 3.0 standard drinks of alcohol     Types: 1 Glasses of wine, 1 Cans of beer, 1 Shots of liquor per week     Comment: socailly 5 drinks in a month    Drug use: No       Medications:  Current Outpatient Medications   Medication Sig Dispense Refill    ibuprofen (ADVIL) 200 MG tablet Take 1 tablet (200 mg)  by mouth as needed for Pain      nortriptyline (PAMELOR) 25 MG capsule TAKE 2 TABLETS BY MOUTH AT BEDTIME 180 capsule 3    Rimegepant Sulfate 75 MG Tablet Dispersible Take 1 tablet (75 mg) by mouth daily as needed (Migraine) 16 tablet 11    Acetaminophen (TYLENOL PO) Take by mouth as needed      loratadine (CLARITIN) 10 MG tablet Take 10 mg by mouth as needed (Patient not taking: Reported on 05/15/2023)       No current facility-administered medications for this visit.       General Exam:  BP 129/80 (BP Site: Left arm, Patient Position: Sitting, Cuff Size: Medium)   Pulse 78   Temp 99.1 F (37.3 C) (Oral)   Resp 16   Ht 1.727 m (5\' 8" )   Wt 78.9 kg (174 lb)   SpO2 97%   BMI 26.46 kg/m   Respiratory rate wnl.   Gen:  Well-developed, well-nourished.  No acute distress.  Cooperative with exam.  HEENT:  NC/AT. No icterus or conjunctival hemorrhage noted.  Neck: Full range of motion.    Lungs: No audible wheezing or dyspnea at rest.   Skin:  No obvious rashes or lesions.   Extrem:  No obvious edema noted in hands.     NEUROLOGICAL EXAM    Mental Status: Awake, alert,  oriented. Speech fluent without dysarthria. Follows commands well. Attention, memory, and concentration intact. Fund of knowledge appropriate for education.     Cranial Nerves: Extraocular movements are full. No nystagmus seen. Facial sensation to light touch appears intact. Face is symmetric. Hearing is intact to conversational speech. Cranial nerves II-XII otherwise appears intact.     Motor: Moves all extremities well    Coordination: No truncal ataxia.    Gait: Normal and steady.      Investigations:  Labs:  Gfr>60, normal    Imaging:  04/2021  Arlys John MRI is normal      Recommendations:  1. Migraine without aura and without status migrainosus, not intractable    2. Medication management      Thomas Carr is a 37 y.o. male who presents for chronic migraine and MSK neck pain.   Brain MRI is normal  Plan:  Continue nortriptyline to 25 mg qhs  Nurtec as needed at migraine onset  If ENT workup does not show sinus disease. We discusse dpossiblity his headcahes might be migrainous  Will then consider stopping nortrpytiline for target migraine preventive like Emgality. Will defer until he sees ENT    F/up in 3 months    40  minutes were spent on the day of service including face to face time with patient, coordinating care, record review, and documentation. Patient has a chronic neurologic condition which will require continued longitudinal follow-up to evaluate and treat it.      Referring (see communications section)      The patient should seek emergent care if there is any change in the symptoms. Proper use and all side effects of medications discussed    Discussed the importance of sleep hygiene, maintaining appropriate hydration, avoid overuse of caffeine and OTC medications for headache lifestyle modification      Please contact me with any questions. Patients and [place] Providers can reach me via MyChart.    Fraser Din, MD  [facility]  [place]: [phone]  [place] Pai: [phone]    *DISCLAIMER: This note was generated by the Epic EMR system/ Dragon  speech recognition and may contain inherent errors or omissions not intended by the user. Grammatical errors, random word insertions, deletions, pronoun errors and incomplete sentences are occasional consequences of this technology due to software limitations. Not all errors are caught or corrected. If there are questions or concerns about the content of this note or information contained within the body of this dictation they should be addressed directly with the author for clarification.*

## 2023-05-22 ENCOUNTER — Ambulatory Visit: Payer: BLUE CROSS/BLUE SHIELD | Admitting: No Specialty

## 2023-10-02 ENCOUNTER — Other Ambulatory Visit: Payer: Self-pay

## 2023-10-02 ENCOUNTER — Ambulatory Visit: Payer: BLUE CROSS/BLUE SHIELD | Attending: Neurology | Admitting: No Specialty

## 2023-10-02 ENCOUNTER — Encounter: Payer: Self-pay | Admitting: No Specialty

## 2023-10-02 VITALS — BP 127/79 | HR 69 | Temp 97.5°F | Resp 16 | Ht 68.0 in | Wt 175.0 lb

## 2023-10-02 DIAGNOSIS — G43009 Migraine without aura, not intractable, without status migrainosus: Secondary | ICD-10-CM

## 2023-10-02 MED ORDER — UBROGEPANT 100 MG PO TABS
100.0000 mg | ORAL_TABLET | Freq: Every day | ORAL | 2 refills | Status: AC | PRN
Start: 2023-10-02 — End: ?

## 2023-10-02 NOTE — Patient Instructions (Signed)
Our plan:             Today's Visit:      In today's visit, I reviewed your medications and records relating your health - prior testing, blood work, reports of other health care providers present in your electronic medical record.     If you have pertinent records from any non-[facility] doctors that you would like to review, please have them sent to us or bring to them to your next office visit.     A copy of today's visit will be sent to your referring doctor and/or primary care doctor, if you have one listed in our system.    Let me know if there are things we could have done better for your office visit.    Patient satisfaction survey:      If you receive a patient satisfaction survey, I would greatly appreciate it if you would complete it. We value your feedback.     Contact me online:      Patient Portal online - Please sign up for MyChart -- this is the best way to communicate with your team here.  There is a messaging feature, where you can us messages anytime of day.  It is the best way to communicate with us and get test results, medication refills, or ask questions.     You can expect to get a response within 24-48 hours during weekdays.  If you do not receive a timely response, resend your request and inform us. My goal is for every question answered ever day.  Average response for a phone call maybe 3 days due to the volume we receive (which is why MyChart is preferred).    If you are having a medical emergency -- call 911, DO NOT SEND A MESSAGE THROUGH MYCHART.     Coupons for medication:      If you have any trouble affording your medications, check out www.goodrx.com for coupons and competitive prices in your area.  If you need further assistance, let us know so we can work with you and your insurance to make sure you get the best care.    Thank you for trusting me with your health.      Take care,   [place], MD  [facility]   IC[facility]: [phone]  [place]: [phone]

## 2023-10-02 NOTE — Progress Notes (Signed)
 10/02/2023      CC: headcahes    History was obtained from patient   History of Present Illness:  Thomas Carr is a 38 y.o. male who p/w migraines.     APR 2025:   History of Present Illness  Thomas Carr is a 38 year old male who presents for follow-up of migraine headaches.    His headaches have improved since the last visit. He did not start the daily medication previously discussed but notes that the frequency of headaches has decreased to two to three per month. These headaches are less intense than before and are identified as migraines. For the current headaches, he uses Nurtec as needed, which he finds effective but notes it takes longer to work, even when taken early. He supplements this with alternative methods such as using ice caps on his head to alleviate symptoms.    Regarding his allergies, he has been prescribed a new nasal spray by his ENT, which combines azelastine  and fluticasone. He reports that it is 'okay' and has an upcoming appointment to discuss potential surgery for his deviated septum. He mentions that the pollen has been particularly bad, affecting his family and friends, and he tries to stay indoors as much as possible.        Dec 2024:   He  has more pressure like headaches with the weather change. Wonders if allergies playing a role. He is plannng to see ENT.   Nurtec is modestly effective.   Reports 8 or more headaches in the last month, most severe occurring 1-2 times weekly.       Dec 2023:   He presents for f/up.   He is doing in regards to his migraines. He does have some difficulty remembering to take the nortriptyline  due to recent increase in his travel schedule.   Nurtec is effective and well tolerated. He reports 8 or less migraines monthly.       June 2023:   He feels the nortriptyline  is effective. He is down to one headcahe per week.   He feels the Nurtec is effective when he takes it early in the headache.     He has had problems with Msk neck pain. It is  stiffness more than anything. No persistent paresthesias or weakness. No B/B changes.       March 2023  Headache hx: since youth, worsenign since 2019  Location:bi frontal and bi-tmeporal  Described as pressure like  Occurs:2-3 times per week  Duration:>4 hours to full day  Intensity: moderate to severe  Aura:none  Associated with photo/phonophobia and nausea  Denies focal motor/sensory or visual changes.   No positional or autonomic symptoms  Exacerbating factors:unknown  Alleviating factors:rest in dark room  Sleep:OK, had mild positional OSA but was told he does not need CPAP definitively  Mood:OK    Previous Preventive Medications:  Gabapentin : ineffective  Nortriptyline : efefctive    Previous Abortive Medications:  Sumatriptan :not efefctive  Rizatriptan : not effective  Nurtec: on currently, ineffective      Past Medical History:  Past Medical History:   Diagnosis Date    Back pain     Migraines     Seasonal allergic rhinitis        Past Surgical History:  History reviewed. No pertinent surgical history.    Allergies:  Patient has no known allergies.    Family History:  Family History   Problem Relation Age of Onset    Hypertension Mother  Diabetes Father     Other Father         Cardiac Arrest     No other family history related to current complaints.    Social History:  Social History     Tobacco Use    Smoking status: Never    Smokeless tobacco: Never    Tobacco comments:     just tried " a long time ago"   Vaping Use    Vaping status: Never Used   Substance Use Topics    Alcohol use: Yes     Alcohol/week: 3.0 standard drinks of alcohol     Types: 1 Glasses of wine, 1 Cans of beer, 1 Shots of liquor per week     Comment: socailly 5 drinks in a month    Drug use: No       Medications:  Current Outpatient Medications   Medication Sig Dispense Refill    Azelastine -Fluticasone 137-50 MCG/ACT Suspension 1 PUFF INTO EACH NOSTRIL TWICE A DAY      ibuprofen  (ADVIL ) 200 MG tablet Take 1 tablet (200 mg) by mouth as  needed for Pain      nortriptyline  (PAMELOR ) 25 MG capsule TAKE 1 tABLET BY MOUTH AT BEDTIME 90 capsule 1    Rimegepant Sulfate  75 MG Tablet Dispersible Take 1 tablet (75 mg) by mouth daily as needed (Migraine) 48 tablet 1    loratadine (CLARITIN) 10 MG tablet Take 10 mg by mouth as needed (Patient not taking: Reported on 05/15/2023)       No current facility-administered medications for this visit.       General Exam:  BP 127/79 (BP Site: Left arm, Patient Position: Sitting, Cuff Size: Medium)   Pulse 69   Temp 97.5 F (36.4 C) (Oral)   Resp 16   Ht 1.727 m (5\' 8" )   Wt 79.4 kg (175 lb)   SpO2 98%   BMI 26.61 kg/m   Respiratory rate wnl.   Gen:  Well-developed, well-nourished.  No acute distress.  Cooperative with exam.  HEENT:  NC/AT. No icterus or conjunctival hemorrhage noted.  Neck: Full range of motion.    Lungs: No audible wheezing or dyspnea at rest.   Skin:  No obvious rashes or lesions.   Extrem:  No obvious edema noted in hands.     NEUROLOGICAL EXAM    Mental Status: Awake, alert, oriented. Speech fluent without dysarthria. Follows commands well. Attention, memory, and concentration intact. Fund of knowledge appropriate for education.     Cranial Nerves: Extraocular movements are full. No nystagmus seen. Facial sensation to light touch appears intact. Face is symmetric. Hearing is intact to conversational speech. Cranial nerves II-XII otherwise appears intact.     Motor: Moves all extremities well    Coordination: No truncal ataxia.    Gait: Normal and steady.      Investigations:  Labs:  Gfr>60, normal    Imaging:  04/2021  [facility] MRI is normal      Recommendations:  1. Migraine without aura and without status migrainosus, not intractable  - ubrogepant (UBRELVY) 100 MG tablet; Take 1 tablet (100 mg) by mouth once daily as needed (migraine. May repeat once PRN in 2 hours. Do not take more than 200mg  in 24 hours)  Dispense: 16 tablet; Refill: 2        Thomas Carr is a 38 y.o. male who  presents for chronic migraine and MSK neck pain.   Brain MRI is normal  Plan:  Stop nurtec  Trial of Ubrelvy as needed  If ineffective, will start CGRP MAB.       F/up in 3-6 months        Referring (see communications section)      The patient should seek emergent care if there is any change in the symptoms. Proper use and all side effects of medications discussed    Discussed the importance of sleep hygiene, maintaining appropriate hydration, avoid overuse of caffeine and OTC medications for headache lifestyle modification      Please contact me with any questions. Patients and [facility] Providers can reach me via MyChart.    Angelique Barer, MD   [facility]  [place]: [phone]  Alethia Huxley: [phone]    *DISCLAIMER: This note was generated by the Epic EMR system/ Dragon speech recognition and may contain inherent errors or omissions not intended by the user. Grammatical errors, random word insertions, deletions, pronoun errors and incomplete sentences are occasional consequences of this technology due to software limitations. Not all errors are caught or corrected. If there are questions or concerns about the content of this note or information contained within the body of this dictation they should be addressed directly with the author for clarification.*

## 2023-10-05 ENCOUNTER — Other Ambulatory Visit: Payer: Self-pay

## 2024-02-15 ENCOUNTER — Encounter (INDEPENDENT_AMBULATORY_CARE_PROVIDER_SITE_OTHER): Payer: Self-pay | Admitting: Internal Medicine

## 2024-02-19 ENCOUNTER — Encounter: Payer: Self-pay | Admitting: No Specialty

## 2024-02-19 ENCOUNTER — Ambulatory Visit
Payer: BLUE CROSS/BLUE SHIELD | Attending: Student in an Organized Health Care Education/Training Program | Admitting: No Specialty

## 2024-02-19 VITALS — BP 115/86 | HR 77 | Temp 98.1°F | Resp 16 | Ht 68.0 in | Wt 174.0 lb

## 2024-02-19 DIAGNOSIS — Z79899 Other long term (current) drug therapy: Secondary | ICD-10-CM

## 2024-02-19 DIAGNOSIS — G43009 Migraine without aura, not intractable, without status migrainosus: Secondary | ICD-10-CM

## 2024-02-19 NOTE — Progress Notes (Signed)
 02/19/2024      CC: headcahes    History was obtained from patient   History of Present Illness:  Thomas Carr is a 38 y.o. male who p/w migraines.     APR 2025:   History of Present Illness  Thomas Carr is a 38 year old male with episodic migraine who presents for a follow-up visit.    Since his last visit in April, he has experienced a reduction in the frequency of his migraines, now experiencing approximately three to five migraines per month. He was started on Ubrelvy  during the last visit . He has not had to take the medication as frequently due to the decreased frequency of migraines.    He manages his migraines with non-pharmacological methods such as using ice caps and resting.     He recently underwent a septoplasty, which he hopes will help with his sleep apnea and potentially reduce his headache frequency. He reports sleeping better since the procedure and is optimistic about the improvement in his nasal congestion, which he believes contributes to his headaches.      April 2205  Thomas Carr is a 38 year old male who presents for follow-up of migraine headaches.    His headaches have improved since the last visit. He did not start the daily medication previously discussed but notes that the frequency of headaches has decreased to two to three per month. These headaches are less intense than before and are identified as migraines. For the current headaches, he uses Nurtec as needed, which he finds effective but notes it takes longer to work, even when taken early. He supplements this with alternative methods such as using ice caps on his head to alleviate symptoms.    Regarding his allergies, he has been prescribed a new nasal spray by his ENT, which combines azelastine  and fluticasone. He reports that it is 'okay' and has an upcoming appointment to discuss potential surgery for his deviated septum. He mentions that the pollen has been particularly bad, affecting his family and friends,  and he tries to stay indoors as much as possible.    Dec 2024:   He  has more pressure like headaches with the weather change. Wonders if allergies playing a role. He is plannng to see ENT.   Nurtec is modestly effective.   Reports 8 or more headaches in the last month, most severe occurring 1-2 times weekly.       Dec 2023:   He presents for f/up.   He is doing in regards to his migraines. He does have some difficulty remembering to take the nortriptyline  due to recent increase in his travel schedule.   Nurtec is effective and well tolerated. He reports 8 or less migraines monthly.       June 2023:   He feels the nortriptyline  is effective. He is down to one headcahe per week.   He feels the Nurtec is effective when he takes it early in the headache.     He has had problems with Msk neck pain. It is stiffness more than anything. No persistent paresthesias or weakness. No B/B changes.       March 2023  Headache hx: since youth, worsenign since 2019  Location:bi frontal and bi-tmeporal  Described as pressure like  Occurs:2-3 times per week  Duration:>4 hours to full day  Intensity: moderate to severe  Aura:none  Associated with photo/phonophobia and nausea  Denies focal motor/sensory or visual changes.   No  positional or autonomic symptoms  Exacerbating factors:unknown  Alleviating factors:rest in dark room  Sleep:OK, had mild positional OSA but was told he does not need CPAP definitively  Mood:OK    Previous Preventive Medications:  Gabapentin : ineffective  Nortriptyline : efefctive    Previous Abortive Medications:  Sumatriptan :not efefctive  Rizatriptan : not effective  Nurtec: on currently, ineffective  Ubrelvy : on ucrrently    Past Medical History:  Past Medical History:   Diagnosis Date    Back pain     Migraines     Seasonal allergic rhinitis        Past Surgical History:  History reviewed. No pertinent surgical history.    Allergies:  Patient has no known allergies.    Family History:  Family History   Problem  Relation Age of Onset    Hypertension Mother     Diabetes Father     Other Father         Cardiac Arrest     No other family history related to current complaints.    Social History:  Social History     Tobacco Use    Smoking status: Never    Smokeless tobacco: Never    Tobacco comments:     just tried  a long time ago   Vaping Use    Vaping status: Never Used   Substance Use Topics    Alcohol use: Yes     Alcohol/week: 3.0 standard drinks of alcohol     Types: 1 Glasses of wine, 1 Cans of beer, 1 Shots of liquor per week     Comment: socailly 5 drinks in a month    Drug use: No       Medications:  Current Outpatient Medications   Medication Sig Dispense Refill    Azelastine -Fluticasone 137-50 MCG/ACT Suspension 1 PUFF INTO EACH NOSTRIL TWICE A DAY      ibuprofen  (ADVIL ) 200 MG tablet Take 1 tablet (200 mg) by mouth as needed for Pain      Rimegepant Sulfate  (Nurtec) 75 MG Tablet Dispersible as needed      ubrogepant  (UBRELVY ) 100 MG tablet Take 1 tablet (100 mg) by mouth once daily as needed (migraine. May repeat once PRN in 2 hours. Do not take more than 200mg  in 24 hours) 16 tablet 2    loratadine (CLARITIN) 10 MG tablet Take 10 mg by mouth as needed (Patient not taking: Reported on 02/19/2024)       No current facility-administered medications for this visit.       General Exam:  BP 115/86 (BP Site: Left arm, Patient Position: Sitting, Cuff Size: Medium)   Pulse 77   Temp 98.1 F (36.7 C) (Oral)   Resp 16   Ht 1.727 m (5' 8)   Wt 78.9 kg (174 lb)   SpO2 98%   BMI 26.46 kg/m   Respiratory rate wnl.   Gen:  Well-developed, well-nourished.  No acute distress.  Cooperative with exam.  HEENT:  NC/AT. No icterus or conjunctival hemorrhage noted.  Neck: Full range of motion.    Lungs: No audible wheezing or dyspnea at rest.   Skin:  No obvious rashes or lesions.   Extrem:  No obvious edema noted in hands.     NEUROLOGICAL EXAM    Mental Status: Awake, alert, oriented. Speech fluent without dysarthria.  Follows commands well. Attention, memory, and concentration intact. Fund of knowledge appropriate for education.     Cranial Nerves: Extraocular movements are full. No nystagmus seen.  Facial sensation to light touch appears intact. Face is symmetric. Hearing is intact to conversational speech. Cranial nerves II-XII otherwise appears intact.     Motor: Moves all extremities well    Coordination: No truncal ataxia.    Gait: Normal and steady.      Investigations:  Labs:  Gfr>60, normal    Imaging:  04/2021  [place] MRI is normal      Recommendations:  1. Migraine without aura and without status migrainosus, not intractable    2. Medication management      Thomas Carr is a 38 y.o. male who presents for chronic migraine and MSK neck pain.   Brain MRI is normal  Plan:  Continue Ubrelvy  as needed    F/up in 12  months    30  minutes were spent on the day of service including face to face time with patient, coordinating care, record review, and documentation.     Referring (see communications section)      The patient should seek emergent care if there is any change in the symptoms. Proper use and all side effects of medications discussed    Discussed the importance of sleep hygiene, maintaining appropriate hydration, avoid overuse of caffeine and OTC medications for headache lifestyle modification      Please contact me with any questions. Patients and [facility] Providers can reach me via MyChart.    Roselie Quail, MD  [facility]  [place]: [phone]  [place]: [phone]    *DISCLAIMER: This note was generated by the Epic EMR system/ Dragon speech recognition and may contain inherent errors or omissions not intended by the user. Grammatical errors, random word insertions, deletions, pronoun errors and incomplete sentences are occasional consequences of this technology due to software limitations. Not all errors are caught or corrected. If there are questions or concerns about the content  of this note or information contained within the body of this dictation they should be addressed directly with the author for clarification.*

## 2024-02-19 NOTE — Patient Instructions (Signed)
 Our plan:     Ubrelvy   Take 1 tab at headache onset. May repeat once as needed in 2 hours.Do not take more than 2 tabs in 24 hours.    Follow the SEEDS for success in headache management, including Sleep hygiene, Exercising regularly, Eating healthy and regular meals, Drinking water, keeping a headache Diary, and Stress reduction.  Limit acute medication use to no more than 3 days per week         Today's Visit:      In today's visit, I reviewed your medications and records relating your health - prior testing, blood work, reports of other health care providers present in your electronic medical record.     If you have pertinent records from any non-[facility] doctors that you would like to review, please have them sent to us  or bring to them to your next office visit.     A copy of today's visit will be sent to your referring doctor and/or primary care doctor, if you have one listed in our system.    Let me know if there are things we could have done better for your office visit.    Patient satisfaction survey:      If you receive a patient satisfaction survey, I would greatly appreciate it if you would complete it. We value your feedback.     Contact me online:      Patient Portal online - Please sign up for MyChart -- this is the best way to communicate with your team here.  There is a messaging feature, where you can us  messages anytime of day.  It is the best way to communicate with us  and get test results, medication refills, or ask questions.     You can expect to get a response within 24-48 hours during weekdays.  If you do not receive a timely response, resend your request and inform us . My goal is for every question answered ever day.  Average response for a phone call maybe 3 days due to the volume we receive (which is why MyChart is preferred).    If you are having a medical emergency -- call 911, DO NOT SEND A MESSAGE THROUGH MYCHART.     Coupons for medication:      If you have any trouble affording your  medications, check out [email or web address].com for coupons and competitive prices in your area.  If you need further assistance, let us  know so we can work with you and your insurance to make sure you get the best care.    Thank you for trusting me with your health.      Take care,  Roselie Quail, MD  [facility]   [facility]: [phone]  [place]: [phone]

## 2024-05-01 ENCOUNTER — Other Ambulatory Visit: Payer: Self-pay

## 2024-05-01 NOTE — Progress Notes (Signed)
 The prescription has been received by the Pharmacy Care Team. A benefit investigation is currently in process.    To follow up on the outcome of the benefit investigation, please check Episodes, Referrals or specialty pharmacy encounter section in the Patient chart review

## 2024-05-01 NOTE — Progress Notes (Signed)
 Prior Authorization Details    Which medication is the prior authorization for? ubrogepant  (UBRELVY ) 100 MG tablet       What is the patient's pharmacy benefit? [id number]       Prior Auth Status: Submitted       Submission Method: Fax

## 2024-05-03 ENCOUNTER — Other Ambulatory Visit: Payer: Self-pay

## 2024-05-03 NOTE — Progress Notes (Signed)
 Prior Authorization Approval Details    Which medication is the prior authorization for? ubrogepant  (UBRELVY ) 100 MG tablet       What is the patient's pharmacy benefit? [id number]       Prior Auth Status: Initial Approved       PA Number: N/A       PA Start Date: 05/01/24       PA Expiration Date: 05/01/25       Estimated Rx Copay: $991.10

## 2025-02-17 ENCOUNTER — Ambulatory Visit: Payer: BLUE CROSS/BLUE SHIELD | Admitting: No Specialty
